# Patient Record
Sex: Male | Born: 1955 | Race: White | Hispanic: No | Marital: Married | Smoking: Former smoker
Health system: Southern US, Community
[De-identification: ages and names within clinical notes are randomized; demographics above are authoritative.]

## PROBLEM LIST (undated history)

## (undated) DIAGNOSIS — M109 Gout, unspecified: Secondary | ICD-10-CM

## (undated) DIAGNOSIS — Z8619 Personal history of other infectious and parasitic diseases: Secondary | ICD-10-CM

## (undated) DIAGNOSIS — Z86010 Personal history of colonic polyps: Secondary | ICD-10-CM

## (undated) HISTORY — DX: Personal history of other infectious and parasitic diseases: Z86.19

## (undated) HISTORY — DX: Personal history of colonic polyps: Z86.010

## (undated) HISTORY — PX: TONSILLECTOMY: SUR1361

---

## 2016-04-11 ENCOUNTER — Emergency Department (INDEPENDENT_AMBULATORY_CARE_PROVIDER_SITE_OTHER)
Admission: EM | Admit: 2016-04-11 | Discharge: 2016-04-11 | Disposition: A | Discharge status: Home / Self Care | Payer: 59 | Source: Home / Self Care | Attending: Family Medicine | Admitting: Family Medicine

## 2016-04-11 ENCOUNTER — Encounter: Payer: Self-pay | Admitting: *Deleted

## 2016-04-11 DIAGNOSIS — Z8679 Personal history of other diseases of the circulatory system: Secondary | ICD-10-CM | POA: Diagnosis not present

## 2016-04-11 DIAGNOSIS — M10072 Idiopathic gout, left ankle and foot: Secondary | ICD-10-CM

## 2016-04-11 HISTORY — DX: Gout, unspecified: M10.9

## 2016-04-11 MED ORDER — INDOMETHACIN 50 MG PO CAPS: 50.0000 mg | ORAL_CAPSULE | Freq: Two times a day (BID) | ORAL | Status: DC

## 2016-04-11 MED ORDER — PREDNISONE 20 MG PO TABS: ORAL_TABLET | ORAL | Status: DC

## 2016-04-11 NOTE — ED Provider Notes (Signed)
CSN: [id number]     Arrival date & time 04/11/16  1254 History   None    Chief Complaint  Patient presents with  . Foot Pain   (Consider location/radiation/quality/duration/timing/severity/associated sxs/prior Treatment) HPI  Randy Henderson is a 60 y.o. male presenting to UC with c/o gradually worsening Left great toe pain, associated mild redness and swelling for 3 days. Hx of gout. Pain feels similar to prior gout flares. Pain is aching and sore, worse with palpation. Pain is 2/10 at this time but he knows it can get a lot worse if he delays treatment. Has had prednisone in the past and does well. Denies being on colchicine or indomethacin in the past for flares. reports only 1 flare per year. Denies injury to his toe or foot. Denies fever, n/v/d.  BP elevated in triage: 169/93. Hx of HTN and notes he was on a "very low dose" BP medication that "didn't seem to be doing anything" so he took himself off the medication and attempted to watch his diet. He notes he checks his BP at home and it is normally good. That was about 1 year ago. Denies headache, dizziness, chest pain or SOB.    Past Medical History  Diagnosis Date  . Gout    Past Surgical History  Procedure Laterality Date  . Tonsillectomy     Family History  Problem Relation Age of Onset  . Heart failure Mother   . Stroke Father    Social History  Substance Use Topics  . Smoking status: Former Smoker -- 0.00 packs/day  . Smokeless tobacco: None  . Alcohol Use: Yes     Comment: 7 q wk    Review of Systems  Constitutional: Negative for fever and chills.  Cardiovascular: Negative for chest pain, palpitations and leg swelling.  Musculoskeletal: Positive for myalgias, joint swelling and arthralgias.       Left great toe  Skin: Positive for color change. Negative for rash and wound.  Neurological: Negative for dizziness, light-headedness and headaches.    Allergies  Review of patient's allergies indicates no known  allergies.  Home Medications   Prior to Admission medications   Medication Sig Start Date End Date Taking? Authorizing Provider  indomethacin (INDOCIN) 50 MG capsule Take 1 capsule (50 mg total) by mouth 2 (two) times daily with a meal. 04/11/16   Noland Fordyce, PA-C  predniSONE (DELTASONE) 20 MG tablet 3 tabs po day one, then 2 po daily x 4 days 04/11/16   Noland Fordyce, PA-C   Meds Ordered and Administered this Visit  Medications - No data to display  BP 151/94 mmHg  Pulse 92  Temp(Src) 98.3 F (36.8 C) (Oral)  Resp 16  Ht 5\' 8"  (1.727 m)  Wt 195 lb (88.451 kg)  BMI 29.66 kg/m2  SpO2 96% No data found.   Physical Exam  Constitutional: He is oriented to person, place, and time. He appears well-developed and well-nourished.  HENT:  Head: Normocephalic and atraumatic.  Eyes: EOM are normal.  Neck: Normal range of motion.  Cardiovascular: Normal rate.   Left great toe: cap refill < 3 seconds  Pulmonary/Chest: Effort normal.  Musculoskeletal: Normal range of motion. He exhibits edema and tenderness.  Left great toe: mild edema, tender to touch. Full ROM. No crepitus.  Neurological: He is alert and oriented to person, place, and time.  Skin: Skin is warm and dry. There is erythema.  Left great toe: skin in tact, mild erythema. No ecchymosis.  Psychiatric: He  has a normal mood and affect. His behavior is normal.  Nursing note and vitals reviewed.   ED Course  Procedures (including critical care time)  Labs Review Labs Reviewed - No data to display  Imaging Review No results found.     MDM   1. Acute idiopathic gout of left foot   2. History of hypertension    Pt c/o Left great toe pain and swelling. Symptoms c/w prior gout flare. Exam c/w gout. No evidence of underlying infection.  BP elevated in triage. Hx of HTN, not currently on BP medication. Pt asymptomatic.  Recheck of BP: 151/94. Encouraged to monitor BP at home.   Rx: prednisone and indomethacin  (advised pt may take OTC Naproxen if he decides he does not want the indomethacin). Encouraged establishing care with a PCP as pt recently moved from [place]. Patient verbalized understanding and agreement with treatment plan.    Noland Fordyce, PA-C 04/11/16 1331

## 2016-04-11 NOTE — Discharge Instructions (Signed)
You have been prescribed 5 days of prednisone, an oral steroid to help with inflammation and itching.  You may start this medication tomorrow with breakfast as it can make it difficult to sleep if taken at night.  You are also being prescribed indomethacin, an antiinflammatory (NSAID) most commonly used for the treatment of pain and inflammation associated with a gout attack.  If you do not want to take indomethacin, you may take over the counter Naproxen (Aleve) 500mg  twice daily for pain and swelling.   Gout Gout is an inflammatory arthritis caused by a buildup of uric acid crystals in the joints. Uric acid is a chemical that is normally present in the blood. When the level of uric acid in the blood is too high it can form crystals that deposit in your joints and tissues. This causes joint redness, soreness, and swelling (inflammation). Repeat attacks are common. Over time, uric acid crystals can form into masses (tophi) near a joint, destroying bone and causing disfigurement. Gout is treatable and often preventable. CAUSES  The disease begins with elevated levels of uric acid in the blood. Uric acid is produced by your body when it breaks down a naturally found substance called purines. Certain foods you eat, such as meats and fish, contain high amounts of purines. Causes of an elevated uric acid level include:  Being passed down from parent to child (heredity).  Diseases that cause increased uric acid production (such as obesity, psoriasis, and certain cancers).  Excessive alcohol use.  Diet, especially diets rich in meat and seafood.  Medicines, including certain cancer-fighting medicines (chemotherapy), water pills (diuretics), and aspirin.  Chronic kidney disease. The kidneys are no longer able to remove uric acid well.  Problems with metabolism. Conditions strongly associated with gout include:  Obesity.  High blood pressure.  High cholesterol.  Diabetes. Not everyone with  elevated uric acid levels gets gout. It is not understood why some people get gout and others do not. Surgery, joint injury, and eating too much of certain foods are some of the factors that can lead to gout attacks. SYMPTOMS   An attack of gout comes on quickly. It causes intense pain with redness, swelling, and warmth in a joint.  Fever can occur.  Often, only one joint is involved. Certain joints are more commonly involved:  Base of the big toe.  Knee.  Ankle.  Wrist.  Finger. Without treatment, an attack usually goes away in a few days to weeks. Between attacks, you usually will not have symptoms, which is different from many other forms of arthritis. DIAGNOSIS  Your caregiver will suspect gout based on your symptoms and exam. In some cases, tests may be recommended. The tests may include:  Blood tests.  Urine tests.  X-rays.  Joint fluid exam. This exam requires a needle to remove fluid from the joint (arthrocentesis). Using a microscope, gout is confirmed when uric acid crystals are seen in the joint fluid. TREATMENT  There are two phases to gout treatment: treating the sudden onset (acute) attack and preventing attacks (prophylaxis).  Treatment of an Acute Attack.  Medicines are used. These include anti-inflammatory medicines or steroid medicines.  An injection of steroid medicine into the affected joint is sometimes necessary.  The painful joint is rested. Movement can worsen the arthritis.  You may use warm or cold treatments on painful joints, depending which works best for you.  Treatment to Prevent Attacks.  If you suffer from frequent gout attacks, your caregiver may advise  preventive medicine. These medicines are started after the acute attack subsides. These medicines either help your kidneys eliminate uric acid from your body or decrease your uric acid production. You may need to stay on these medicines for a very long time.  The early phase of treatment  with preventive medicine can be associated with an increase in acute gout attacks. For this reason, during the first few months of treatment, your caregiver may also advise you to take medicines usually used for acute gout treatment. Be sure you understand your caregiver's directions. Your caregiver may make several adjustments to your medicine dose before these medicines are effective.  Discuss dietary treatment with your caregiver or dietitian. Alcohol and drinks high in sugar and fructose and foods such as meat, poultry, and seafood can increase uric acid levels. Your caregiver or dietitian can advise you on drinks and foods that should be limited. HOME CARE INSTRUCTIONS   Do not take aspirin to relieve pain. This raises uric acid levels.  Only take over-the-counter or prescription medicines for pain, discomfort, or fever as directed by your caregiver.  Rest the joint as much as possible. When in bed, keep sheets and blankets off painful areas.  Keep the affected joint raised (elevated).  Apply warm or cold treatments to painful joints. Use of warm or cold treatments depends on which works best for you.  Use crutches if the painful joint is in your leg.  Drink enough fluids to keep your urine clear or pale yellow. This helps your body get rid of uric acid. Limit alcohol, sugary drinks, and fructose drinks.  Follow your dietary instructions. Pay careful attention to the amount of protein you eat. Your daily diet should emphasize fruits, vegetables, whole grains, and fat-free or low-fat milk products. Discuss the use of coffee, vitamin C, and cherries with your caregiver or dietitian. These may be helpful in lowering uric acid levels.  Maintain a healthy body weight. SEEK MEDICAL CARE IF:   You develop diarrhea, vomiting, or any side effects from medicines.  You do not feel better in 24 hours, or you are getting worse. SEEK IMMEDIATE MEDICAL CARE IF:   Your joint becomes suddenly more  tender, and you have chills or a fever. MAKE SURE YOU:   Understand these instructions.  Will watch your condition.  Will get help right away if you are not doing well or get worse.   This information is not intended to replace advice given to you by your health care provider. Make sure you discuss any questions you have with your health care provider.   Document Released: 10/14/2000 Document Revised: 11/07/2014 Document Reviewed: 05/30/2012 Elsevier Interactive Patient Education 2016 [place] are compounds that affect the level of uric acid in your body. A low-purine diet is a diet that is low in purines. Eating a low-purine diet can prevent the level of uric acid in your body from getting too high and causing gout or kidney stones or both. WHAT DO I NEED TO KNOW ABOUT THIS DIET?  Choose low-purine foods. Examples of low-purine foods are listed in the next section.  Drink plenty of fluids, especially water. Fluids can help remove uric acid from your body. Try to drink 8-16 cups (1.9-3.8 L) a day.  Limit foods high in fat, especially saturated fat, as fat makes it harder for the body to get rid of uric acid. Foods high in saturated fat include pizza, cheese, ice cream, whole milk, fried foods, and  gravies. Choose foods that are lower in fat and lean sources of protein. Use olive oil when cooking as it contains healthy fats that are not high in saturated fat.  Limit alcohol. Alcohol interferes with the elimination of uric acid from your body. If you are having a gout attack, avoid all alcohol.  Keep in mind that different people's bodies react differently to different foods. You will probably learn over time which foods do or do not affect you. If you discover that a food tends to cause your gout to flare up, avoid eating that food. You can more freely enjoy foods that do not cause problems. If you have any questions about a food item, talk to your dietitian or  health care provider. WHICH FOODS ARE LOW, MODERATE, AND HIGH IN PURINES? The following is a list of foods that are low, moderate, and high in purines. You can eat any amount of the foods that are low in purines. You may be able to have small amounts of foods that are moderate in purines. Ask your health care provider how much of a food moderate in purines you can have. Avoid foods high in purines. Grains  Foods low in purines: Enriched white bread, pasta, rice, cake, cornbread, popcorn.  Foods moderate in purines: Whole-grain breads and cereals, wheat germ, bran, oatmeal. Uncooked oatmeal. Dry wheat bran or wheat germ.  Foods high in purines: Pancakes, [place] toast, biscuits, muffins. Vegetables  Foods low in purines: All vegetables, except those that are moderate in purines.  Foods moderate in purines: Asparagus, cauliflower, spinach, mushrooms, green peas. Fruits  All fruits are low in purines. Meats and other Protein Foods  Foods low in purines: Eggs, nuts, peanut butter.  Foods moderate in purines: 80-90% lean beef, lamb, veal, pork, poultry, fish, eggs, peanut butter, nuts. Crab, lobster, oysters, and shrimp. Cooked dried beans, peas, and lentils.  Foods high in purines: Anchovies, sardines, herring, mussels, tuna, codfish, scallops, trout, and haddock. [place] Salines. Organ meats (such as liver or kidney). Tripe. Game meat. Goose. Sweetbreads. Dairy  All dairy foods are low in purines. Low-fat and fat-free dairy products are best because they are low in saturated fat. Beverages  Drinks low in purines: Water, carbonated beverages, tea, coffee, cocoa.  Drinks moderate in purines: Soft drinks and other drinks sweetened with high-fructose corn syrup. Juices. To find whether a food or drink is sweetened with high-fructose corn syrup, look at the ingredients list.  Drinks high in purines: Alcoholic beverages (such as beer). Condiments  Foods low in purines: Salt, herbs, olives, pickles,  relishes, vinegar.  Foods moderate in purines: Butter, margarine, oils, mayonnaise. Fats and Oils  Foods low in purines: All types, except gravies and sauces made with meat.  Foods high in purines: Gravies and sauces made with meat. Other Foods  Foods low in purines: Sugars, sweets, gelatin. Cake. Soups made without meat.  Foods moderate in purines: Meat-based or fish-based soups, broths, or bouillons. Foods and drinks sweetened with high-fructose corn syrup.  Foods high in purines: High-fat desserts (such as ice cream, cookies, cakes, pies, doughnuts, and chocolate). Contact your dietitian for more information on foods that are not listed here.   This information is not intended to replace advice given to you by your health care provider. Make sure you discuss any questions you have with your health care provider.   Document Released: 02/11/2011 Document Revised: 10/22/2013 Document Reviewed: 09/23/2013 Elsevier Interactive Patient Education [employer].

## 2016-04-11 NOTE — ED Notes (Signed)
Pt c/o LT great toe and foot swelling x 3 days. Reports hx of gout. Denies injury.

## 2017-08-28 ENCOUNTER — Encounter: Payer: Self-pay | Admitting: Emergency Medicine

## 2017-08-28 ENCOUNTER — Emergency Department (INDEPENDENT_AMBULATORY_CARE_PROVIDER_SITE_OTHER)
Admission: EM | Admit: 2017-08-28 | Discharge: 2017-08-28 | Disposition: A | Discharge status: Home / Self Care | Payer: 59 | Source: Home / Self Care | Attending: Family Medicine | Admitting: Family Medicine

## 2017-08-28 DIAGNOSIS — M10072 Idiopathic gout, left ankle and foot: Secondary | ICD-10-CM

## 2017-08-28 MED ORDER — PREDNISONE 20 MG PO TABS: ORAL_TABLET | ORAL | 0 refills | Status: DC

## 2017-08-28 MED ORDER — INDOMETHACIN 50 MG PO CAPS: 50.0000 mg | ORAL_CAPSULE | Freq: Two times a day (BID) | ORAL | 1 refills | Status: DC

## 2017-08-28 NOTE — ED Provider Notes (Signed)
[name]    CSN: [id number] Arrival date & time: 08/28/17  1448     History   Chief Complaint Chief Complaint  Patient presents with  . Foot Pain    HPI Randy Henderson is a 61 y.o. male.   HPI  Randy Henderson is a 61 y.o. male presenting to UC with c/o sudden onset Left foot pain in his 2nd toe that started last night. Pain is burning and sore. Pain similar to prior gout flares. Last flare was about 1 year ago. He does well on indomethacin and prednisone.  Denies injury to his foot or toes.    Past Medical History:  Diagnosis Date  . Gout     There are no active problems to display for this patient.   Past Surgical History:  Procedure Laterality Date  . TONSILLECTOMY         Home Medications    Prior to Admission medications   Medication Sig Start Date End Date Taking? Authorizing Provider  indomethacin (INDOCIN) 50 MG capsule Take 1 capsule (50 mg total) by mouth 2 (two) times daily with a meal. 08/28/17   Gerarda Fraction, Bronwen Betters, PA-C  predniSONE (DELTASONE) 20 MG tablet 3 tabs po day one, then 2 po daily x 4 days 08/28/17   Noe Gens, PA-C    Family History Family History  Problem Relation Age of Onset  . Heart failure Mother   . Stroke Father     Social History Social History  Substance Use Topics  . Smoking status: Former Smoker    Packs/day: 0.00  . Smokeless tobacco: Never Used  . Alcohol use Yes     Comment: 7 q wk     Allergies   Patient has no known allergies.   Review of Systems Review of Systems  Constitutional: Negative for chills and fever.  Musculoskeletal: Positive for arthralgias. Negative for joint swelling and myalgias.  Skin: Negative for color change, rash and wound.  Neurological: Negative for weakness and numbness.     Physical Exam Triage Vital Signs ED Triage Vitals  Enc Vitals Group     BP 08/28/17 1515 (!) 149/90     Pulse Rate 08/28/17 1515 70     Resp --      Temp 08/28/17 1515 97.8 F (36.6  C)     Temp Source 08/28/17 1515 Oral     SpO2 08/28/17 1515 96 %     Weight 08/28/17 1516 200 lb (90.7 kg)     Height --      Head Circumference --      Peak Flow --      Pain Score 08/28/17 1516 3     Pain Loc --      Pain Edu? --      Excl. in [place]? --    No data found.   Updated Vital Signs BP (!) 149/90 (BP Location: Right Arm)   Pulse 70   Temp 97.8 F (36.6 C) (Oral)   Wt 200 lb (90.7 kg)   SpO2 96%   BMI 30.41 kg/m      Physical Exam  Constitutional: He is oriented to person, place, and time. He appears well-developed and well-nourished. No distress.  HENT:  Head: Normocephalic and atraumatic.  Eyes: EOM are normal.  Neck: Normal range of motion.  Cardiovascular: Normal rate.   Pulmonary/Chest: Effort normal.  Musculoskeletal: Normal range of motion. He exhibits tenderness. He exhibits no edema.  Left foot: second toe- tender. No  obvious edema. Full ROM all toes. No tenderness to great toe.   Neurological: He is alert and oriented to person, place, and time.  Skin: Skin is warm and dry. Capillary refill takes less than 2 seconds. He is not diaphoretic. No erythema.  Left foot including 2nd toe: skin in tact. No ecchymosis or erythema.   Psychiatric: He has a normal mood and affect. His behavior is normal.  Nursing note and vitals reviewed.    UC Treatments / Results  Labs (all labs ordered are listed, but only abnormal results are displayed) Labs Reviewed - No data to display  EKG  EKG Interpretation None       Radiology No results found.  Procedures Procedures (including critical care time)  Medications Ordered in UC Medications - No data to display   Initial Impression / Assessment and Plan / UC Course  I have reviewed the triage vital signs and the nursing notes.  Pertinent labs & imaging results that were available during my care of the patient were reviewed by me and considered in my medical decision making (see chart for details).       Hx and exam c/w gout Will prescribe indomethacin and prednisone as pt has done well with this combination in the past.   Encouraged him to monitor his BP F/u with PCP for ongoing healthcare needs including recurrent gout and BP.  Final Clinical Impressions(s) / UC Diagnoses   Final diagnoses:  Acute idiopathic gout involving toe of left foot    New Prescriptions Discharge Medication List as of 08/28/2017  3:31 PM       Controlled Substance Prescriptions [place] Controlled Substance Registry consulted? Not Applicable   [name] 08/28/17 1627

## 2017-08-28 NOTE — Discharge Instructions (Signed)
°  Indomethacin is an antiinflammatory medication, it is recommended that you do not take other NSAIDs such as ibuprofen, naproxen or aspirin.  This can lead to stomach upset or even stomach ulcers.

## 2017-08-28 NOTE — ED Triage Notes (Signed)
Pt c/o left foot pain that started suddenly last night. Pain is on bottom of foot. States hx of gout and sxs feel like that.

## 2018-01-29 ENCOUNTER — Other Ambulatory Visit: Payer: Self-pay

## 2018-01-29 ENCOUNTER — Emergency Department (INDEPENDENT_AMBULATORY_CARE_PROVIDER_SITE_OTHER)
Admission: EM | Admit: 2018-01-29 | Discharge: 2018-01-29 | Disposition: A | Discharge status: Home / Self Care | Payer: 59 | Source: Home / Self Care

## 2018-01-29 ENCOUNTER — Encounter: Payer: Self-pay | Admitting: *Deleted

## 2018-01-29 DIAGNOSIS — M109 Gout, unspecified: Secondary | ICD-10-CM

## 2018-01-29 DIAGNOSIS — M79672 Pain in left foot: Secondary | ICD-10-CM

## 2018-01-29 MED ORDER — PREDNISONE 10 MG PO TABS: ORAL_TABLET | ORAL | 0 refills | Status: DC

## 2018-01-29 MED ORDER — INDOMETHACIN 25 MG PO CAPS: 25.0000 mg | ORAL_CAPSULE | Freq: Three times a day (TID) | ORAL | 0 refills | Status: DC | PRN

## 2018-01-29 NOTE — ED Triage Notes (Signed)
Pt c/o LT foot pain x 2 days. He reports a hx of gout. He has taken Prednisone in the past with relief.

## 2018-01-29 NOTE — ED Provider Notes (Signed)
[name]    CSN: [id number] Arrival date & time: 01/29/18  1010     History   Chief Complaint Chief Complaint  Patient presents with  . Foot Pain    HPI Randy Henderson is a 62 y.o. male.   HPI Patient is having pain in his left ankle.  This is much like the pain he has had with gout in the past.  No injury.  No known food trigger.  He is been having flareups a little more often of late.  He is reluctant to think about any long-term medication treatment.  Prednisone always calmed right down. Past Medical History:  Diagnosis Date  . Gout     There are no active problems to display for this patient.   Past Surgical History:  Procedure Laterality Date  . TONSILLECTOMY         Home Medications    Prior to Admission medications   Not on File    Family History Family History  Problem Relation Age of Onset  . Heart failure Mother   . Stroke Father     Social History Social History   Tobacco Use  . Smoking status: Former Smoker    Packs/day: 0.00  . Smokeless tobacco: Never Used  Substance Use Topics  . Alcohol use: Yes    Comment: 7 q wk  . Drug use: No     Allergies   Patient has no known allergies.   Review of Systems Review of Systems No other major complaints. Physical Exam Triage Vital Signs ED Triage Vitals  Enc Vitals Group     BP 01/29/18 1106 (!) 160/99     Pulse Rate 01/29/18 1106 60     Resp 01/29/18 1106 18     Temp 01/29/18 1106 98.2 F (36.8 C)     Temp Source 01/29/18 1106 Oral     SpO2 01/29/18 1106 97 %     Weight 01/29/18 1107 203 lb (92.1 kg)     Height 01/29/18 1107 5\' 8"  (1.727 m)     Head Circumference --      Peak Flow --      Pain Score 01/29/18 1107 2     Pain Loc --      Pain Edu? --      Excl. in [place]? --    No data found.  Updated Vital Signs BP (!) 160/99 (BP Location: Right Arm)   Pulse 60   Temp 98.2 F (36.8 C) (Oral)   Resp 18   Ht 5\' 8"  (1.727 m)   Wt 203 lb (92.1 kg)   SpO2 97%    BMI 30.87 kg/m   Visual Acuity Right Eye Distance:   Left Eye Distance:   Bilateral Distance:    Right Eye Near:   Left Eye Near:    Bilateral Near:     Physical Exam  Mild erythema of left great toe but it is not painful.  Left ankle laterally just anterior to the lateral malleolus seems to be the area of maximum tenderness and pain. UC Treatments / Results  Labs (all labs ordered are listed, but only abnormal results are displayed) Labs Reviewed - No data to display  EKG None Radiology No results found.  Procedures Procedures (including critical care time)  Medications Ordered in UC Medications - No data to display   Initial Impression / Assessment and Plan / UC Course  I have reviewed the triage vital signs and the nursing notes.  Pertinent labs & imaging results that were available during my care of the patient were reviewed by me and considered in my medical decision making (see chart for details).     Acute gouty arthritis.  With more frequency of attacks I have recommended he consider being on allopurinol if the level is significantly elevated.  We do not have a baseline, but will check some blood work while he is here today.  We will let him know the results of that.  Final Clinical Impressions(s) / UC Diagnoses   Final diagnoses:  None    ED Discharge Orders    None     Drink plenty of fluids to stay well-hydrated.  Review online the listing of foods known to trigger gout.  Take prednisone 3 daily for 2 days, then 2 daily for 2 days, then 1 daily for 2 days  Take indomethacin 25 mg 1 3 times daily as needed for painful inflammation  I am checking your uric acid level and liver and kidney function tests.  It is important to know what kind of a baseline level you run.  If the uric acid is too high, it is worth seriously considering going on long-term prophylactic medications such as allopurinol.  Return as needed  Controlled Substance  Prescriptions [place] Controlled Substance Registry consulted? No   Posey Boyer, MD 01/29/18 1212

## 2018-01-29 NOTE — Discharge Instructions (Addendum)
Drink plenty of fluids to stay well-hydrated.  Review online the listing of foods known to trigger gout.  Take prednisone 3 daily for 2 days, then 2 daily for 2 days, then 1 daily for 2 days  Take indomethacin 25 mg 1 3 times daily as needed for painful inflammation  I am checking your uric acid level and liver and kidney function tests.  It is important to know what kind of a baseline level you run.  If the uric acid is too high, it is worth seriously considering going on long-term prophylactic medications such as allopurinol.  Return as needed

## 2018-01-30 ENCOUNTER — Telehealth: Payer: Self-pay

## 2018-01-30 LAB — COMPLETE METABOLIC PANEL WITH GFR
AG RATIO: 1.5 (calc) (ref 1.0–2.5)
ALT: 24 U/L (ref 9–46)
AST: 20 U/L (ref 10–35)
Albumin: 4.1 g/dL (ref 3.6–5.1)
Alkaline phosphatase (APISO): 68 U/L (ref 40–115)
BILIRUBIN TOTAL: 0.4 mg/dL (ref 0.2–1.2)
BUN: 17 mg/dL (ref 7–25)
CALCIUM: 9.3 mg/dL (ref 8.6–10.3)
CO2: 28 mmol/L (ref 20–32)
Chloride: 105 mmol/L (ref 98–110)
Creat: 0.97 mg/dL (ref 0.70–1.25)
GFR, EST AFRICAN AMERICAN: 97 mL/min/{1.73_m2} (ref >=60)
GFR, EST NON AFRICAN AMERICAN: 83 mL/min/{1.73_m2} (ref >=60)
GLUCOSE: 107 mg/dL — AB (ref 65–99)
Globulin: 2.7 g/dL (calc) (ref 1.9–3.7)
Potassium: 4.8 mmol/L (ref 3.5–5.3)
SODIUM: 139 mmol/L (ref 135–146)
Total Protein: 6.8 g/dL (ref 6.1–8.1)

## 2018-01-30 LAB — URIC ACID: URIC ACID, SERUM: 6.2 mg/dL (ref 4.0–8.0)

## 2018-01-30 NOTE — Telephone Encounter (Signed)
Left message for patient with lab results and follow up that Dr. Linna Darner would like him to have a uric acid level drawn at some point when he is not having a gout attack to see what his "normal" is.  Left call back information any questions

## 2018-08-16 ENCOUNTER — Other Ambulatory Visit: Payer: Self-pay

## 2018-08-16 ENCOUNTER — Ambulatory Visit (INDEPENDENT_AMBULATORY_CARE_PROVIDER_SITE_OTHER): Payer: 59

## 2018-08-16 ENCOUNTER — Ambulatory Visit (INDEPENDENT_AMBULATORY_CARE_PROVIDER_SITE_OTHER): Payer: 59 | Admitting: Family Medicine

## 2018-08-16 ENCOUNTER — Encounter: Payer: Self-pay | Admitting: Family Medicine

## 2018-08-16 VITALS — BP 121/80 | HR 81 | Temp 98.1°F | Resp 16 | Ht 68.0 in | Wt 203.1 lb

## 2018-08-16 DIAGNOSIS — Z1283 Encounter for screening for malignant neoplasm of skin: Secondary | ICD-10-CM

## 2018-08-16 DIAGNOSIS — M25521 Pain in right elbow: Secondary | ICD-10-CM | POA: Diagnosis not present

## 2018-08-16 DIAGNOSIS — Z23 Encounter for immunization: Secondary | ICD-10-CM

## 2018-08-16 DIAGNOSIS — Z1211 Encounter for screening for malignant neoplasm of colon: Secondary | ICD-10-CM | POA: Diagnosis not present

## 2018-08-16 DIAGNOSIS — E66811 Obesity, class 1: Secondary | ICD-10-CM | POA: Insufficient documentation

## 2018-08-16 DIAGNOSIS — M545 Low back pain, unspecified: Secondary | ICD-10-CM

## 2018-08-16 DIAGNOSIS — E669 Obesity, unspecified: Secondary | ICD-10-CM

## 2018-08-16 DIAGNOSIS — S3992XA Unspecified injury of lower back, initial encounter: Secondary | ICD-10-CM | POA: Diagnosis not present

## 2018-08-16 MED ORDER — MELOXICAM 15 MG PO TABS: 15.0000 mg | ORAL_TABLET | Freq: Every day | ORAL | 0 refills | Status: DC

## 2018-08-16 NOTE — Patient Instructions (Addendum)
Schedule your complete physical in 6 months GO to our [place] office ([place]) for your Annabelle Harman notify you of your lab results and make any changes if needed START the Meloxicam once daily- take w/ food- for pain/inflammation ICE the elbow HEAT the back We'll call you with your derm and GI appts Call with any questions or concerns- particularly if pain is not improving Welcome!  We're glad to have you!!

## 2018-08-16 NOTE — Assessment & Plan Note (Signed)
New to provider, ongoing for pt.  Localized to lumbar spine.  Questionable decreased disc height.  Will get xrays to assess.  Start scheduled NSAIDs.  If no improvement, will need referral.  Pt expressed understanding and is in agreement w/ plan.

## 2018-08-16 NOTE — Assessment & Plan Note (Signed)
New.  Pt's frustrated that his exercise is limited by back pain.  Is swimming for exercise.  Check labs to risk stratify.  Will follow.

## 2018-08-16 NOTE — Progress Notes (Signed)
   Subjective:    Patient ID: Randy Henderson, male    DOB: 08-12-1956, 62 y.o.   MRN: [id number]  HPI New to establish.  No recent PCP.  Tennis Elbow- R elbow 'swelled up like a baseball in March'.  Since then has noticed decreased strength by 'maybe 50%'.  R hand dominant.  No known injury.  Denies TTP.  Tends to have pain w/ repetitive motion.  Back pain- 10-12 yrs ago lifted something improperly and was in bed for 2 weeks.  sxs completely resolved and then 1 yr ago again lifted improperly.  Has had pain since.  Pain is localized to lower back.  Pain is midline and will radiate to R side.  Pain will radiate into buttock and thigh.  No numbness/tingling associated.  Does not take OTC meds.  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  Obesity- BMI 30.9.  + family hx CAD.  Exercise has been limited due to back pain but was previously very active.  Will swim.    Skin check- pt would like derm referral  Health maintenance- has never had colonoscopy, due for Tdap.    Review of Systems For ROS see HPI     Objective:   Physical Exam  Constitutional: He is oriented to person, place, and time. He appears well-developed and well-nourished. No distress.  obese  HENT:  Head: Normocephalic and atraumatic.  Eyes: Pupils are equal, round, and reactive to light. Conjunctivae and EOM are normal.  Neck: Normal range of motion. Neck supple. No thyromegaly present.  Cardiovascular: Normal rate, regular rhythm, normal heart sounds and intact distal pulses.  No murmur heard. Pulmonary/Chest: Effort normal and breath sounds normal. No respiratory distress.  Abdominal: Soft. Bowel sounds are normal. He exhibits no distension.  Musculoskeletal: He exhibits tenderness (TTP over lumbar spine w/ questionable reduced disc space). He exhibits no edema.  TTP over medial and lateral epicondyle  Lymphadenopathy:    He has no cervical adenopathy.  Neurological: He is alert and oriented to person, place,  and time. No cranial nerve deficit.  (-) SLR bilaterally  Skin: Skin is warm and dry.  Psychiatric: He has a normal mood and affect. His behavior is normal.  Vitals reviewed.         Assessment & Plan:

## 2018-08-16 NOTE — Assessment & Plan Note (Signed)
New.  Pt w/ both medial and lateral epicondylitis.  Start scheduled NSAIDs.  Ice.  If no improvement will need either immobilization or steroid injection.  Pt expressed understanding and is in agreement w/ plan.

## 2018-08-17 LAB — CBC WITH DIFFERENTIAL/PLATELET
BASOS PCT: 0.9 % (ref 0.0–3.0)
Basophils Absolute: 0.1 10*3/uL (ref 0.0–0.1)
EOS ABS: 0.1 10*3/uL (ref 0.0–0.7)
Eosinophils Relative: 1.5 % (ref 0.0–5.0)
HCT: 44.2 % (ref 39.0–52.0)
Hemoglobin: 15.1 g/dL (ref 13.0–17.0)
LYMPHS ABS: 2.4 10*3/uL (ref 0.7–4.0)
Lymphocytes Relative: 35.7 % (ref 12.0–46.0)
MCHC: 34.2 g/dL (ref 30.0–36.0)
MCV: 91.3 fl (ref 78.0–100.0)
MONO ABS: 0.7 10*3/uL (ref 0.1–1.0)
Monocytes Relative: 10.3 % (ref 3.0–12.0)
NEUTROS ABS: 3.5 10*3/uL (ref 1.4–7.7)
NEUTROS PCT: 51.6 % (ref 43.0–77.0)
PLATELETS: 207 10*3/uL (ref 150.0–400.0)
RBC: 4.84 Mil/uL (ref 4.22–5.81)
RDW: 12.9 % (ref 11.5–15.5)
WBC: 6.8 10*3/uL (ref 4.0–10.5)

## 2018-08-17 LAB — LIPID PANEL
CHOLESTEROL: 169 mg/dL (ref 0–200)
HDL: 40.6 mg/dL (ref >39.00)
NONHDL: 127.91
TRIGLYCERIDES: 294 mg/dL — AB (ref 0.0–149.0)
Total CHOL/HDL Ratio: 4
VLDL: 58.8 mg/dL — ABNORMAL HIGH (ref 0.0–40.0)

## 2018-08-17 LAB — BASIC METABOLIC PANEL
BUN: 24 mg/dL — ABNORMAL HIGH (ref 6–23)
CHLORIDE: 100 meq/L (ref 96–112)
CO2: 31 meq/L (ref 19–32)
CREATININE: 1.16 mg/dL (ref 0.40–1.50)
Calcium: 9.9 mg/dL (ref 8.4–10.5)
GFR: 67.48 mL/min (ref >60.00)
Glucose, Bld: 102 mg/dL — ABNORMAL HIGH (ref 70–99)
Potassium: 3.8 mEq/L (ref 3.5–5.1)
Sodium: 139 mEq/L (ref 135–145)

## 2018-08-17 LAB — HEPATIC FUNCTION PANEL
ALT: 16 U/L (ref 0–53)
AST: 16 U/L (ref 0–37)
Albumin: 4.7 g/dL (ref 3.5–5.2)
Alkaline Phosphatase: 65 U/L (ref 39–117)
BILIRUBIN DIRECT: 0.1 mg/dL (ref 0.0–0.3)
TOTAL PROTEIN: 7.8 g/dL (ref 6.0–8.3)
Total Bilirubin: 0.7 mg/dL (ref 0.2–1.2)

## 2018-08-17 LAB — TSH: TSH: 1.37 u[IU]/mL (ref 0.35–4.50)

## 2018-08-17 LAB — LDL CHOLESTEROL, DIRECT: LDL DIRECT: 87 mg/dL

## 2018-08-22 ENCOUNTER — Encounter: Payer: Self-pay | Admitting: Internal Medicine

## 2018-09-08 ENCOUNTER — Other Ambulatory Visit: Payer: Self-pay | Admitting: Family Medicine

## 2018-09-24 ENCOUNTER — Encounter: Payer: Self-pay | Admitting: Internal Medicine

## 2018-09-24 ENCOUNTER — Ambulatory Visit (AMBULATORY_SURGERY_CENTER): Payer: Self-pay | Admitting: *Deleted

## 2018-09-24 VITALS — Ht 68.0 in | Wt 203.0 lb

## 2018-09-24 DIAGNOSIS — Z1211 Encounter for screening for malignant neoplasm of colon: Secondary | ICD-10-CM

## 2018-09-24 NOTE — Progress Notes (Signed)
No egg or soy allergy known to patient  No issues with past sedation with any surgeries  or procedures, no past intubation  No diet pills per patient No home 02 use per patient  No blood thinners per patient  Pt denies issues with constipation  No A fib or A flutter  EMMI video sent to pt's e mail -- pt declined   

## 2018-10-08 ENCOUNTER — Ambulatory Visit (AMBULATORY_SURGERY_CENTER): Payer: 59 | Admitting: Internal Medicine

## 2018-10-08 ENCOUNTER — Encounter: Payer: Self-pay | Admitting: Internal Medicine

## 2018-10-08 VITALS — BP 129/82 | HR 78 | Temp 98.0°F | Resp 18 | Ht 68.0 in | Wt 203.0 lb

## 2018-10-08 DIAGNOSIS — D125 Benign neoplasm of sigmoid colon: Secondary | ICD-10-CM | POA: Diagnosis not present

## 2018-10-08 DIAGNOSIS — D124 Benign neoplasm of descending colon: Secondary | ICD-10-CM

## 2018-10-08 DIAGNOSIS — Z1211 Encounter for screening for malignant neoplasm of colon: Secondary | ICD-10-CM

## 2018-10-08 HISTORY — PX: COLONOSCOPY W/ POLYPECTOMY: SHX1380

## 2018-10-08 MED ORDER — SODIUM CHLORIDE 0.9 % IV SOLN: 500.0000 mL | Freq: Once | INTRAVENOUS | Status: DC

## 2018-10-08 NOTE — Progress Notes (Signed)
To recovery, report to RN, VSS. 

## 2018-10-08 NOTE — Progress Notes (Signed)
Called to room to assist during endoscopic procedure.  Patient ID and intended procedure confirmed with present staff. Received instructions for my participation in the procedure from the performing physician.  

## 2018-10-08 NOTE — Patient Instructions (Addendum)
2 little polyps removed today - I will let you know pathology results and when to have another routine colonoscopy by mail and/or My Chart.  You do have diverticulosis - thickened muscle rings and pouches in the colon wall. Please read the handout about this condition.  I appreciate the opportunity to care for you. Gatha Mayer, MD, FACG   YOU HAD AN ENDOSCOPIC PROCEDURE TODAY AT [facility]:   Refer to the procedure report that was given to you for any specific questions about what was found during the examination.  If the procedure report does not answer your questions, please call your gastroenterologist to clarify.  If you requested that your care partner not be given the details of your procedure findings, then the procedure report has been included in a sealed envelope for you to review at your convenience later.  YOU SHOULD EXPECT: Some feelings of bloating in the abdomen. Passage of more gas than usual.  Walking can help get rid of the air that was put into your GI tract during the procedure and reduce the bloating. If you had a lower endoscopy (such as a colonoscopy or flexible sigmoidoscopy) you may notice spotting of blood in your stool or on the toilet paper. If you underwent a bowel prep for your procedure, you may not have a normal bowel movement for a few days.  Please Note:  You might notice some irritation and congestion in your nose or some drainage.  This is from the oxygen used during your procedure.  There is no need for concern and it should clear up in a day or so.  SYMPTOMS TO REPORT IMMEDIATELY:   Following lower endoscopy (colonoscopy or flexible sigmoidoscopy):  Excessive amounts of blood in the stool  Significant tenderness or worsening of abdominal pains  Swelling of the abdomen that is new, acute  Fever of 100F or higher    For urgent or emergent issues, a gastroenterologist can be reached at any hour by calling [phone]  [phone].   DIET:  We do recommend a small meal at first, but then you may proceed to your regular diet.  Drink plenty of fluids but you should avoid alcoholic beverages for 24 hours.  ACTIVITY:  You should plan to take it easy for the rest of today and you should NOT DRIVE or use heavy machinery until tomorrow (because of the sedation medicines used during the test).    FOLLOW UP: Our staff will call the number listed on your records the next business day following your procedure to check on you and address any questions or concerns that you may have regarding the information given to you following your procedure. If we do not reach you, we will leave a message.  However, if you are feeling well and you are not experiencing any problems, there is no need to return our call.  We will assume that you have returned to your regular daily activities without incident.  If any biopsies were taken you will be contacted by phone or by letter within the next 1-3 weeks.  Please call us at [phone] if you have not heard about the biopsies in 3 weeks.    SIGNATURES/CONFIDENTIALITY: You and/or your care partner have signed paperwork which will be entered into your electronic medical record.  These signatures attest to the fact that that the information above on your After Visit Summary has been reviewed and is understood.  Full responsibility of the confidentiality of  this discharge information lies with you and/or your care-partner.

## 2018-10-08 NOTE — Op Note (Signed)
[facility] Patient Name: Randy Henderson Procedure Date: 10/08/2018 8:27 AM MRN: [id number] Endoscopist: Gatha Mayer , MD Age: 62 Referring MD:  Date of Birth: 1956-08-11 Gender: Male Account #: [id number] Procedure:                Colonoscopy Indications:              Screening for colorectal malignant neoplasm, This                            is the patient's first colonoscopy Medicines:                Propofol per Anesthesia, Monitored Anesthesia Care Procedure:                Pre-Anesthesia Assessment:                           - Prior to the procedure, a History and Physical                            was performed, and patient medications and                            allergies were reviewed. The patient's tolerance of                            previous anesthesia was also reviewed. The risks                            and benefits of the procedure and the sedation                            options and risks were discussed with the patient.                            All questions were answered, and informed consent                            was obtained. Prior Anticoagulants: The patient has                            taken no previous anticoagulant or antiplatelet                            agents. ASA Grade Assessment: II - A patient with                            mild systemic disease. After reviewing the risks                            and benefits, the patient was deemed in                            satisfactory condition to undergo the procedure.  After obtaining informed consent, the colonoscope                            was passed under direct vision. Throughout the                            procedure, the patient's blood pressure, pulse, and                            oxygen saturations were monitored continuously. The                            Colonoscope was introduced through the anus and   advanced to the the cecum, identified by                            appendiceal orifice and ileocecal valve. The                            colonoscopy was performed without difficulty. The                            patient tolerated the procedure well. The quality                            of the bowel preparation was excellent. The                            ileocecal valve, appendiceal orifice, and rectum                            were photographed. The bowel preparation used was                            Miralax. Scope In: 8:33:36 AM Scope Out: 8:48:23 AM Scope Withdrawal Time: 0 hours 13 minutes 15 seconds  Total Procedure Duration: 0 hours 14 minutes 47 seconds  Findings:                 The perianal and digital rectal examinations were                            normal. Pertinent negatives include normal prostate                            (size, shape, and consistency).                           Two sessile polyps were found in the sigmoid colon                            and descending colon. The polyps were 2 to 6 mm in                            size. These polyps were removed  with a cold snare.                            Resection and retrieval were complete. Verification                            of patient identification for the specimen was done.                           Multiple diverticula were found in the sigmoid                            colon and descending colon.                           The exam was otherwise without abnormality on                            direct and retroflexion views. Complications:            No immediate complications. Estimated Blood Loss:     Estimated blood loss was minimal. Impression:               - Two 2 to 6 mm polyps in the sigmoid colon and in                            the descending colon, removed with a cold snare.                            Resected and retrieved.                           - Diverticulosis in the sigmoid  colon and in the                            descending colon.                           - The examination was otherwise normal on direct                            and retroflexion views. Recommendation:           - Patient has a contact number available for                            emergencies. The signs and symptoms of potential                            delayed complications were discussed with the                            patient. Return to normal activities tomorrow.                            Written discharge instructions were provided  to the                            patient.                           - Resume previous diet.                           - Continue present medications.                           - Repeat colonoscopy is recommended. The                            colonoscopy date will be determined after pathology                            results from today's exam become available for                            review. Gatha Mayer, MD 10/08/2018 8:53:43 AM This report has been signed electronically.

## 2018-10-09 ENCOUNTER — Telehealth: Payer: Self-pay

## 2018-10-09 NOTE — Telephone Encounter (Signed)
Second follow up call attempt, no answer, message left 

## 2018-10-09 NOTE — Telephone Encounter (Signed)
First post procedure follow up call, no answer 

## 2018-10-14 ENCOUNTER — Encounter: Payer: Self-pay | Admitting: Internal Medicine

## 2018-10-14 DIAGNOSIS — Z86010 Personal history of colonic polyps: Secondary | ICD-10-CM

## 2018-10-14 DIAGNOSIS — Z860101 Personal history of adenomatous and serrated colon polyps: Secondary | ICD-10-CM | POA: Insufficient documentation

## 2018-10-14 HISTORY — DX: Personal history of adenomatous and serrated colon polyps: Z86.0101

## 2018-10-14 HISTORY — DX: Personal history of colonic polyps: Z86.010

## 2018-10-14 NOTE — Progress Notes (Signed)
Adenoma and benign mucosa Recall 2026 My Chart

## 2018-11-19 DIAGNOSIS — D229 Melanocytic nevi, unspecified: Secondary | ICD-10-CM | POA: Diagnosis not present

## 2018-11-19 DIAGNOSIS — L821 Other seborrheic keratosis: Secondary | ICD-10-CM | POA: Diagnosis not present

## 2019-01-24 ENCOUNTER — Encounter: Payer: Self-pay | Admitting: *Deleted

## 2019-02-19 ENCOUNTER — Encounter: Payer: 59 | Admitting: Family Medicine

## 2019-04-08 ENCOUNTER — Telehealth: Payer: 59 | Admitting: Physician Assistant

## 2019-04-08 ENCOUNTER — Encounter: Payer: Self-pay | Admitting: Physician Assistant

## 2019-04-08 ENCOUNTER — Telehealth: Payer: Self-pay | Admitting: Family Medicine

## 2019-04-08 MED ORDER — CYCLOBENZAPRINE HCL 10 MG PO TABS: 10.0000 mg | ORAL_TABLET | Freq: Three times a day (TID) | ORAL | 0 refills | Status: DC | PRN

## 2019-04-08 MED ORDER — NAPROXEN 500 MG PO TABS: 500.0000 mg | ORAL_TABLET | Freq: Two times a day (BID) | ORAL | 0 refills | Status: DC

## 2019-04-08 NOTE — Telephone Encounter (Signed)
Pt has been scheduled.  °

## 2019-04-08 NOTE — Telephone Encounter (Signed)
Pt states that he has done something to his back again and asking if he could have the referral that was discuss back in Oct. Or is another OV needed. Please advise.

## 2019-04-08 NOTE — Progress Notes (Signed)
Pt's message is intended for his primary care provider for referral to specialist. He asked the medications send to the pharmacy to be cancelled. Called pharmacy and cancelled medications.

## 2019-04-08 NOTE — Addendum Note (Signed)
Addended by: Waldon Merl on: 04/08/2019 10:00 AM   Modules accepted: Orders

## 2019-04-08 NOTE — Progress Notes (Signed)

## 2019-04-08 NOTE — Telephone Encounter (Signed)
Pt would need a new appt. If wanting today could see Einar Pheasant in a VV

## 2019-04-11 ENCOUNTER — Encounter: Payer: Self-pay | Admitting: Family Medicine

## 2019-04-11 ENCOUNTER — Ambulatory Visit (INDEPENDENT_AMBULATORY_CARE_PROVIDER_SITE_OTHER): Payer: 59 | Admitting: Family Medicine

## 2019-04-11 ENCOUNTER — Other Ambulatory Visit: Payer: Self-pay

## 2019-04-11 ENCOUNTER — Ambulatory Visit (INDEPENDENT_AMBULATORY_CARE_PROVIDER_SITE_OTHER): Payer: 59

## 2019-04-11 VITALS — BP 133/89 | HR 90 | Temp 97.9°F | Resp 16 | Ht 68.0 in | Wt 201.5 lb

## 2019-04-11 DIAGNOSIS — Z0000 Encounter for general adult medical examination without abnormal findings: Secondary | ICD-10-CM

## 2019-04-11 DIAGNOSIS — Z125 Encounter for screening for malignant neoplasm of prostate: Secondary | ICD-10-CM | POA: Diagnosis not present

## 2019-04-11 DIAGNOSIS — Z0001 Encounter for general adult medical examination with abnormal findings: Secondary | ICD-10-CM

## 2019-04-11 DIAGNOSIS — E669 Obesity, unspecified: Secondary | ICD-10-CM

## 2019-04-11 DIAGNOSIS — M545 Low back pain, unspecified: Secondary | ICD-10-CM

## 2019-04-11 MED ORDER — PREDNISONE 10 MG PO TABS: ORAL_TABLET | ORAL | 0 refills | Status: DC

## 2019-04-11 MED ORDER — CYCLOBENZAPRINE HCL 10 MG PO TABS: 10.0000 mg | ORAL_TABLET | Freq: Three times a day (TID) | ORAL | 0 refills | Status: DC | PRN

## 2019-04-11 NOTE — Assessment & Plan Note (Signed)
Ongoing issue for pt.  Stressed need for healthy diet and regular exercise.  Given family hx of CAD, will get EKG today as baseline and consider stress test based on labs.  Pt expressed understanding and is in agreement w/ plan.

## 2019-04-11 NOTE — Progress Notes (Signed)
   Subjective:    Patient ID: Randy Henderson, male    DOB: December 18, 1955, 63 y.o.   MRN: [id number]  HPI CPE- UTD on colonoscopy, immunizations.   Review of Systems Patient reports no vision/hearing changes, anorexia, fever ,adenopathy, persistant/recurrent hoarseness, swallowing issues, chest pain, palpitations, edema, persistant/recurrent cough, hemoptysis, dyspnea (rest,exertional, paroxysmal nocturnal), gastrointestinal  bleeding (melena, rectal bleeding), abdominal pain, excessive heart burn, GU symptoms (dysuria, hematuria, voiding/incontinence issues) syncope, focal weakness, memory loss, numbness & tingling, skin/hair/nail changes, depression, anxiety, abnormal bruising/bleeding.   + sciatica x3 weeks, R sided.  Worse w/ standing, walking.  Pain is radiating to ankle.  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    Objective:   Physical Exam General Appearance:    Alert, cooperative, no distress, appears stated age  Head:    Normocephalic, without obvious abnormality, atraumatic  Eyes:    PERRL, conjunctiva/corneas clear, EOM's intact, fundi    benign, both eyes       Ears:    Normal TM's and external ear canals, both ears  Nose:   Nares normal, septum midline, mucosa normal, no drainage   or sinus tenderness  Throat:   Lips, mucosa, and tongue normal; teeth and gums normal  Neck:   Supple, symmetrical, trachea midline, no adenopathy;       thyroid:  No enlargement/tenderness/nodules  Back:     Symmetric, no curvature, ROM normal, no CVA tenderness  Lungs:     Clear to auscultation bilaterally, respirations unlabored  Chest wall:    No tenderness or deformity  Heart:    Regular rate and rhythm, S1 and S2 normal, no murmur, rub   or gallop  Abdomen:     Soft, non-tender, bowel sounds active all four quadrants,    no masses, no organomegaly  Genitalia:   deferred due to back pain  Rectal:    Extremities:   Extremities normal, atraumatic,  no cyanosis or edema  Pulses:   2+ and symmetric all extremities  Skin:   Skin color, texture, turgor normal, no rashes or lesions  Lymph nodes:   Cervical, supraclavicular, and axillary nodes normal  Neurologic:   CNII-XII intact. Normal strength, sensation and reflexes      Throughout.  + SLR bilaterally          Assessment & Plan:

## 2019-04-11 NOTE — Assessment & Plan Note (Signed)
Recurrent issue for pt.  Sxs and PE consistent w/ sciatica.  Start Prednisone and Flexeril.  Reviewed supportive care and red flags that should prompt return.  Pt expressed understanding and is in agreement w/ plan.

## 2019-04-11 NOTE — Addendum Note (Signed)
Addended by: Doran Clay A on: 04/11/2019 02:57 PM   Modules accepted: Orders

## 2019-04-11 NOTE — Patient Instructions (Signed)
Follow up in 1 year or as needed We'll notify you of your lab results and make any changes if needed Continue to work on healthy diet and regular exercise- you can do it! START the Prednisone as directed- take w/ food USE the Cyclobenzaprine at night and as needed for muscle spasm- may cause drowsiness Call with any questions or concerns Stay Safe!

## 2019-04-11 NOTE — Assessment & Plan Note (Signed)
Pt's PE WNL w/ exception of obesity and R sided sciatica.  Encouraged healthy diet and regular exercise.  Check labs.  Anticipatory guidance provided.

## 2019-04-12 ENCOUNTER — Other Ambulatory Visit: Payer: Self-pay | Admitting: Family Medicine

## 2019-04-12 DIAGNOSIS — E781 Pure hyperglyceridemia: Secondary | ICD-10-CM

## 2019-04-12 DIAGNOSIS — Z8249 Family history of ischemic heart disease and other diseases of the circulatory system: Secondary | ICD-10-CM

## 2019-04-12 DIAGNOSIS — R972 Elevated prostate specific antigen [PSA]: Secondary | ICD-10-CM

## 2019-04-12 DIAGNOSIS — E669 Obesity, unspecified: Secondary | ICD-10-CM

## 2019-04-12 LAB — CBC WITH DIFFERENTIAL/PLATELET
Basophils Absolute: 0.1 10*3/uL (ref 0.0–0.1)
Basophils Relative: 0.9 % (ref 0.0–3.0)
Eosinophils Absolute: 0.1 10*3/uL (ref 0.0–0.7)
Eosinophils Relative: 1.4 % (ref 0.0–5.0)
HCT: 44 % (ref 39.0–52.0)
Hemoglobin: 14.6 g/dL (ref 13.0–17.0)
Lymphocytes Relative: 35.4 % (ref 12.0–46.0)
Lymphs Abs: 2.3 10*3/uL (ref 0.7–4.0)
MCHC: 33.2 g/dL (ref 30.0–36.0)
MCV: 91.5 fl (ref 78.0–100.0)
Monocytes Absolute: 0.7 10*3/uL (ref 0.1–1.0)
Monocytes Relative: 10.3 % (ref 3.0–12.0)
Neutro Abs: 3.4 10*3/uL (ref 1.4–7.7)
Neutrophils Relative %: 52 % (ref 43.0–77.0)
Platelets: 213 10*3/uL (ref 150.0–400.0)
RBC: 4.81 Mil/uL (ref 4.22–5.81)
RDW: 12.8 % (ref 11.5–15.5)
WBC: 6.6 10*3/uL (ref 4.0–10.5)

## 2019-04-12 LAB — HEPATIC FUNCTION PANEL
ALT: 15 U/L (ref 0–53)
AST: 17 U/L (ref 0–37)
Albumin: 4.6 g/dL (ref 3.5–5.2)
Alkaline Phosphatase: 65 U/L (ref 39–117)
Bilirubin, Direct: 0.1 mg/dL (ref 0.0–0.3)
Total Bilirubin: 0.7 mg/dL (ref 0.2–1.2)
Total Protein: 7.5 g/dL (ref 6.0–8.3)

## 2019-04-12 LAB — LIPID PANEL
Cholesterol: 161 mg/dL (ref 0–200)
HDL: 38.8 mg/dL — ABNORMAL LOW (ref >39.00)
NonHDL: 122.23
Total CHOL/HDL Ratio: 4
Triglycerides: 247 mg/dL — ABNORMAL HIGH (ref 0.0–149.0)
VLDL: 49.4 mg/dL — ABNORMAL HIGH (ref 0.0–40.0)

## 2019-04-12 LAB — BASIC METABOLIC PANEL
BUN: 19 mg/dL (ref 6–23)
CO2: 28 mEq/L (ref 19–32)
Calcium: 9.9 mg/dL (ref 8.4–10.5)
Chloride: 102 mEq/L (ref 96–112)
Creatinine, Ser: 1.1 mg/dL (ref 0.40–1.50)
GFR: 67.37 mL/min (ref >60.00)
Glucose, Bld: 78 mg/dL (ref 70–99)
Potassium: 4.3 mEq/L (ref 3.5–5.1)
Sodium: 138 mEq/L (ref 135–145)

## 2019-04-12 LAB — PSA: PSA: 4.35 ng/mL — ABNORMAL HIGH (ref 0.10–4.00)

## 2019-04-12 LAB — TSH: TSH: 1.48 u[IU]/mL (ref 0.35–4.50)

## 2019-04-12 LAB — LDL CHOLESTEROL, DIRECT: Direct LDL: 80 mg/dL

## 2019-04-12 NOTE — Addendum Note (Signed)
Addended by: Midge Minium on: 04/12/2019 04:15 PM   Modules accepted: Orders

## 2019-04-16 ENCOUNTER — Telehealth: Payer: Self-pay

## 2019-04-16 ENCOUNTER — Telehealth: Payer: Self-pay | Admitting: General Practice

## 2019-04-16 NOTE — Telephone Encounter (Deleted)
Patient called, left VM to return the call to [phone] between the hours 0700-1900 to schedule covid testing.

## 2019-04-16 NOTE — Telephone Encounter (Signed)
This encounter was created in error - please disregard.

## 2019-04-16 NOTE — Addendum Note (Signed)
Addended by: Matilde Sprang on: 04/16/2019 02:56 PM   Modules accepted: Level of Service, SmartSet

## 2019-04-16 NOTE — Telephone Encounter (Signed)
Patient called, left VM to return the call to [phone] between the hours 0700-1900 to schedule covid testing.

## 2019-04-16 NOTE — Telephone Encounter (Signed)
Copied from [facility] [id number]. Topic: General - Inquiry >> Apr 16, 2019 12:23 PM Richardo Priest, NT wrote: Reason for CRM: Gesala with [facility] called in requesting that the patient be tested for COVID prior to having their appointment the 19th. Please advise.

## 2019-04-19 ENCOUNTER — Ambulatory Visit (HOSPITAL_COMMUNITY)
Admission: RE | Admit: 2019-04-19 | Payer: 59 | Source: Ambulatory Visit | Attending: Family Medicine | Admitting: Family Medicine

## 2019-04-20 ENCOUNTER — Encounter: Payer: Self-pay | Admitting: Family Medicine

## 2019-04-20 DIAGNOSIS — M5410 Radiculopathy, site unspecified: Secondary | ICD-10-CM

## 2019-04-20 DIAGNOSIS — M545 Low back pain, unspecified: Secondary | ICD-10-CM

## 2019-04-30 ENCOUNTER — Other Ambulatory Visit: Payer: Self-pay

## 2019-04-30 ENCOUNTER — Encounter: Payer: Self-pay | Admitting: Family Medicine

## 2019-04-30 ENCOUNTER — Ambulatory Visit (INDEPENDENT_AMBULATORY_CARE_PROVIDER_SITE_OTHER): Payer: 59 | Admitting: Family Medicine

## 2019-04-30 DIAGNOSIS — M5441 Lumbago with sciatica, right side: Secondary | ICD-10-CM

## 2019-04-30 DIAGNOSIS — G8929 Other chronic pain: Secondary | ICD-10-CM

## 2019-04-30 MED ORDER — NABUMETONE 750 MG PO TABS: 750.0000 mg | ORAL_TABLET | Freq: Two times a day (BID) | ORAL | 6 refills | Status: DC | PRN

## 2019-04-30 NOTE — Progress Notes (Signed)
I saw and examined the patient with Dr. Okey Dupre and agree with assessment and plan as outlined.  LBP with right-sided sciatica, non-focal exam.  Will try PT, ESI.  If still not better, then MRI.

## 2019-04-30 NOTE — Progress Notes (Signed)
  Randy Henderson - 64 y.o. male MRN [id number]  Date of birth: 11/05/1955    SUBJECTIVE:      Chief Complaint: low back pain  HPI:  63 year old male with history of low back pain.  Initially he had pain isolated to his low sacral area for about the past year.  Now, over the past 4-1/2 months the pain has worsened and is radiating down his right leg.  He reports radiation into the buttock, lateral thigh crossing over the shin and into the medial aspect of the foot.  He also reports numbness in the same distribution.  He denies any specific injury that worsen his pain, but feels it was due to doing more yard work recently.  He recently saw his PCP who obtain x-rays, started him on prednisone for 1 week as well as Flexeril.  He denies any benefit from the medications.  He does, however, get some relief with ibuprofen.  He denies any focal weakness.  He denies any bowel or bladder symptoms.  No saddle anesthesia.  No recent fevers or chills.   ROS:     See HPI. All other reviewed systems negative.  PERTINENT  PMH / PSH FH / / SH:  Past Medical, Surgical, Social, and Family History Reviewed & Updated in the EMR.    OBJECTIVE: There were no vitals taken for this visit.  Physical Exam:  Vital signs are reviewed.  GEN: Alert and oriented, NAD Pulm: Breathing unlabored PSY: normal mood, congruent affect  MSK: Lumbar spine: - Inspection: no gross deformity or asymmetry, swelling or ecchymosis. No skin changes - Palpation: Midline tenderness over L4/L5.  He is more tender over the right paraspinal musculature in this area. - ROM: full active ROM of the lumbar spine in flexion and extension with some discomfort. - Strength: 5/5 strength of lower extremity in L4-S1 nerve root distributions b/l - Neuro: sensation intact in the L4-S1 nerve root distribution b/l, 2+ L4 and S1 reflexes - Special testing: Positive straight leg raise on the right  Right Hip: Mild TTP over GT 5/5 hip strength. Full  ROM.  NV intact  Left Hip: 5/5 strength with full ROM NV intact  ASSESSMENT & PLAN:  1. Low back pain with radiculopathy, right L4 nerve root. No focal weakness or red flag symptoms. 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 If symptoms persisting, consider MRI

## 2019-05-07 ENCOUNTER — Encounter: Payer: Self-pay | Admitting: Family Medicine

## 2019-05-27 ENCOUNTER — Telehealth (HOSPITAL_COMMUNITY): Payer: Self-pay | Admitting: Radiology

## 2019-05-27 NOTE — Telephone Encounter (Signed)
Left message to call office-Patient needs to schedule a stress echocardiogram. Please transfer to department to get patient scheduled.

## 2019-05-29 ENCOUNTER — Encounter: Payer: Self-pay | Admitting: Family Medicine

## 2019-05-29 DIAGNOSIS — G8929 Other chronic pain: Secondary | ICD-10-CM

## 2019-06-10 ENCOUNTER — Ambulatory Visit (INDEPENDENT_AMBULATORY_CARE_PROVIDER_SITE_OTHER): Payer: 59 | Admitting: Physical Medicine and Rehabilitation

## 2019-06-10 ENCOUNTER — Ambulatory Visit: Payer: Self-pay

## 2019-06-10 ENCOUNTER — Encounter: Payer: Self-pay | Admitting: Physical Medicine and Rehabilitation

## 2019-06-10 VITALS — BP 156/102 | HR 89

## 2019-06-10 DIAGNOSIS — M5416 Radiculopathy, lumbar region: Secondary | ICD-10-CM

## 2019-06-10 MED ORDER — BETAMETHASONE SOD PHOS & ACET 6 (3-3) MG/ML IJ SUSP
12.0000 mg | Freq: Once | INTRAMUSCULAR | Status: AC
  Administered 2019-06-10: 12 mg

## 2019-06-10 NOTE — Progress Notes (Signed)
 .  Numeric Pain Rating Scale and Functional Assessment Average Pain 8   In the last MONTH (on 0-10 scale) has pain interfered with the following?  1. General activity like being  able to carry out your everyday physical activities such as walking, climbing stairs, carrying groceries, or moving a chair?  Rating(7)   +Driver, -BT, -Dye Allergies.  

## 2019-06-11 ENCOUNTER — Telehealth: Payer: Self-pay | Admitting: Family Medicine

## 2019-06-11 NOTE — Telephone Encounter (Signed)
Lumbar MRI was previously reviewed with patient by Dr. 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  He had an ESI yesterday.  He'll follow up based on his response.

## 2019-06-11 NOTE — Procedures (Signed)
Lumbosacral Transforaminal Epidural Steroid Injection - Sub-Pedicular Approach with Fluoroscopic Guidance  Patient: Randy Henderson      Date of Birth: 25-Dec-1955 MRN: [id number] PCP: Midge Minium, MD      Visit Date: 06/10/2019   Universal Protocol:    Date/Time: 06/10/2019  Consent Given By: the patient  Position: PRONE  Additional Comments: Vital signs were monitored before and after the procedure. Patient was prepped and draped in the usual sterile fashion. The correct patient, procedure, and site was verified.   Injection Procedure Details:  Procedure Site One Meds Administered:  Meds ordered this encounter  Medications  . betamethasone acetate-betamethasone sodium phosphate (CELESTONE) injection 12 mg    Laterality: Right  Location/Site:  L5-S1  Needle size: 22 G  Needle type: Spinal  Needle Placement: Transforaminal  Findings:    -Comments: Excellent flow of contrast along the nerve and into the epidural space.  Procedure Details: After squaring off the end-plates to get a true AP view, the C-arm was positioned so that an oblique view of the foramen as noted above was visualized. The target area is just inferior to the "nose of the scotty dog" or sub pedicular. The soft tissues overlying this structure were infiltrated with 2-3 ml. of 1% Lidocaine without Epinephrine.  The spinal needle was inserted toward the target using a "trajectory" view along the fluoroscope beam.  Under AP and lateral visualization, the needle was advanced so it did not puncture dura and was located close the 6 O'Clock position of the pedical in AP tracterory. Biplanar projections were used to confirm position. Aspiration was confirmed to be negative for CSF and/or blood. A 1-2 ml. volume of Isovue-250 was injected and flow of contrast was noted at each level. Radiographs were obtained for documentation purposes.   After attaining the desired flow of contrast documented above, a 0.5  to 1.0 ml test dose of 0.25% Marcaine was injected into each respective transforaminal space.  The patient was observed for 90 seconds post injection.  After no sensory deficits were reported, and normal lower extremity motor function was noted,   the above injectate was administered so that equal amounts of the injectate were placed at each foramen (level) into the transforaminal epidural space.   Additional Comments:  The patient tolerated the procedure well Dressing: 2 x 2 sterile gauze and Band-Aid    Post-procedure details: Patient was observed during the procedure. Post-procedure instructions were reviewed.  Patient left the clinic in stable condition.

## 2019-06-11 NOTE — Progress Notes (Signed)
Randy Henderson - 63 y.o. male MRN [id number]  Date of birth: Sep 14, 1956  Office Visit Note: Visit Date: 06/10/2019 PCP: Midge Minium, MD Referred by: Midge Minium, MD  Subjective: Chief Complaint  Patient presents with   Lower Back - Pain   HPI:  Randy Henderson is a 63 y.o. male who comes in today For planned right L4 transforaminal injection as requested by Dr. Eunice Blase.  According to the notes the patient was having radicular leg pain on the right for several weeks now, approximately 9 weeks.  He really has failed conservative care with medication management exercise etc.  He reports worsening with standing and walking and better at rest.  The symptoms were described as posterior lateral hip into the thigh and across the knee and then the medial side of the leg.  This would be a pretty classic L4 distribution.  Epidural was requested and depending on relief MRI was going to be ordered.  Evidently MRI was actually ordered before he came in he has had the MRI completed.  I did go over the MRI with him using spine models and imaging.  This MRI is reviewed below.  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  This could clearly give him some L5 type symptoms or below but the L4 nerve root escapes pretty well.  He also has central disc protrusion here as well.  At L5-S1 there is a right-sided paracentral protrusion that could affect the S1 nerve root but no focal compression.  Patient symptoms are as classic L5 symptoms as really have seen.  He gets symptoms posterior lateral leg to the knee but really into the anterior lateral shin and occasionally the ankle which is pretty classic for the L5 distribution.  I think given those findings and after I talked with the patient we are going to complete an L5 transforaminal injection.   Depending on relief would look at repeat injection or intermittent injections and hopefully over time if the disc protrusion is what is causing some of the issue that will regress.  He would likely do well potentially with different medication trial but I think given his age and activity at 89 he might do well with lumbar decompression.  There is no listhesis and I doubt he would need any sort of fusion procedure.  ROS Otherwise per HPI.  Assessment & Plan: Visit Diagnoses:  1. Lumbar radiculopathy     Plan: No additional findings.   Meds & Orders:  Meds ordered this encounter  Medications   betamethasone acetate-betamethasone sodium phosphate (CELESTONE) injection 12 mg    Orders Placed This Encounter  Procedures   XR C-ARM NO REPORT   Epidural Steroid injection    Follow-up: Return if symptoms worsen or fail to improve.   Procedures: No procedures performed  Lumbosacral Transforaminal Epidural Steroid Injection - Sub-Pedicular Approach with Fluoroscopic Guidance  Patient: Randy Henderson      Date of Birth: May 05, 1956 MRN: [id number] PCP: Midge Minium, MD      Visit Date: 06/10/2019   Universal Protocol:    Date/Time: 06/10/2019  Consent Given By: the patient  Position: PRONE  Additional Comments: Vital signs were monitored before and after the procedure. Patient was prepped and draped in the usual sterile fashion. The correct patient, procedure, and site was verified.   Injection Procedure  Details:  Procedure Site One Meds Administered:  Meds ordered this encounter  Medications   betamethasone acetate-betamethasone sodium phosphate (CELESTONE) injection 12 mg    Laterality: Right  Location/Site:  L5-S1  Needle size: 22 G  Needle type: Spinal  Needle Placement: Transforaminal  Findings:    -Comments: Excellent flow of contrast along the nerve and into the epidural space.  Procedure Details: After squaring off the end-plates to get a true  AP view, the C-arm was positioned so that an oblique view of the foramen as noted above was visualized. The target area is just inferior to the "nose of the scotty dog" or sub pedicular. The soft tissues overlying this structure were infiltrated with 2-3 ml. of 1% Lidocaine without Epinephrine.  The spinal needle was inserted toward the target using a "trajectory" view along the fluoroscope beam.  Under AP and lateral visualization, the needle was advanced so it did not puncture dura and was located close the 6 O'Clock position of the pedical in AP tracterory. Biplanar projections were used to confirm position. Aspiration was confirmed to be negative for CSF and/or blood. A 1-2 ml. volume of Isovue-250 was injected and flow of contrast was noted at each level. Radiographs were obtained for documentation purposes.   After attaining the desired flow of contrast documented above, a 0.5 to 1.0 ml test dose of 0.25% Marcaine was injected into each respective transforaminal space.  The patient was observed for 90 seconds post injection.  After no sensory deficits were reported, and normal lower extremity motor function was noted,   the above injectate was administered so that equal amounts of the injectate were placed at each foramen (level) into the transforaminal epidural space.   Additional Comments:  The patient tolerated the procedure well Dressing: 2 x 2 sterile gauze and Band-Aid    Post-procedure details: Patient was observed during the procedure. Post-procedure instructions were reviewed.  Patient left the clinic in stable condition.    Clinical History: MRI LUMBAR SPINE WITHOUT CONTRAST  TECHNIQUE: Multiplanar, multisequence MR imaging of the lumbar spine was performed. No intravenous contrast was administered.  COMPARISON: Lumbar radiographs 08/16/2018  FINDINGS: Segmentation: Normal  Alignment: Slight retrolisthesis L2-3. Remaining alignment normal.  Vertebrae: Negative for  fracture or mass. Large hemangioma L4 vertebral body.  Conus medullaris and cauda equina: Conus extends to the L1-2 level. Conus and cauda equina appear normal.  Paraspinal and other soft tissues: Negative for paraspinous mass or adenopathy. No soft tissue edema. Aorta normal in caliber.  Disc levels:  Congenitally small spinal canal with superimposed degenerative change at multiple levels.  L1-2: Mild disc bulging and mild facet degeneration. No significant stenosis  L2-3: Diffuse disc bulging and mild facet degeneration. Mild spinal stenosis. Small left-sided disc protrusion.  L3-4: Mild disc bulging and mild facet degeneration. Mild spinal stenosis  L4-5: Severe spinal stenosis. Central disc protrusion as well as diffuse endplate spurring and bilateral facet degeneration contribute to stenosis. Severe subarticular stenosis bilaterally.  L5-S1: Moderate subarticular stenosis on the right due to spurring. Impingement right S1 nerve root.  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   Electronically Signed By: Franchot Gallo M.D. On: 06/09/2019 14:19     Objective:  VS:  HT:     WT:    BMI:      BP:(!)  156/102   HR:89bpm   TEMP: ( )   RESP:  Physical Exam  Ortho Exam Imaging: Xr C-arm No Report  Result Date: 06/10/2019 Please see Notes tab for imaging impression.

## 2019-07-10 ENCOUNTER — Encounter: Payer: 59 | Admitting: Family Medicine

## 2019-10-03 ENCOUNTER — Encounter: Payer: Self-pay | Admitting: Family Medicine

## 2019-12-07 ENCOUNTER — Ambulatory Visit: Payer: 59 | Attending: Internal Medicine

## 2019-12-07 DIAGNOSIS — Z23 Encounter for immunization: Secondary | ICD-10-CM

## 2019-12-07 NOTE — Progress Notes (Signed)
   Covid-19 Vaccination Clinic  Name:  Randy Henderson    MRN: [id number] DOB: August 28, 1956  12/07/2019  [name] was observed post Covid-19 immunization for 15 minutes without incidence. He was provided with Vaccine Information Sheet and instruction to access the V-Safe system.   [name] was instructed to call 911 with any severe reactions post vaccine: Marland Kitchen Difficulty breathing  . Swelling of your face and throat  . A fast heartbeat  . A bad rash all over your body  . Dizziness and weakness    Immunizations Administered    Name Date Dose VIS Date Route   Pfizer COVID-19 Vaccine 12/07/2019 10:55 AM 0.3 mL 10/11/2019 Intramuscular   Manufacturer: [place]   Lot: [id number]   [place]: [id number]

## 2019-12-10 ENCOUNTER — Emergency Department (HOSPITAL_COMMUNITY): Payer: 59

## 2019-12-10 ENCOUNTER — Other Ambulatory Visit: Payer: Self-pay

## 2019-12-10 ENCOUNTER — Encounter (HOSPITAL_COMMUNITY): Payer: Self-pay | Admitting: Emergency Medicine

## 2019-12-10 ENCOUNTER — Emergency Department (HOSPITAL_COMMUNITY)
Admission: EM | Admit: 2019-12-10 | Discharge: 2019-12-11 | Disposition: A | Discharge status: Home / Self Care | Payer: 59 | Attending: Emergency Medicine | Admitting: Emergency Medicine

## 2019-12-10 DIAGNOSIS — Y939 Activity, unspecified: Secondary | ICD-10-CM | POA: Diagnosis not present

## 2019-12-10 DIAGNOSIS — Z87891 Personal history of nicotine dependence: Secondary | ICD-10-CM | POA: Diagnosis not present

## 2019-12-10 DIAGNOSIS — M7022 Olecranon bursitis, left elbow: Secondary | ICD-10-CM | POA: Diagnosis not present

## 2019-12-10 DIAGNOSIS — Z79899 Other long term (current) drug therapy: Secondary | ICD-10-CM | POA: Insufficient documentation

## 2019-12-10 DIAGNOSIS — R0789 Other chest pain: Secondary | ICD-10-CM

## 2019-12-10 LAB — PROTIME-INR
INR: 0.9 (ref 0.8–1.2)
Prothrombin Time: 12.4 seconds (ref 11.4–15.2)

## 2019-12-10 LAB — CBC
HCT: 44.2 % (ref 39.0–52.0)
Hemoglobin: 14.7 g/dL (ref 13.0–17.0)
MCH: 30.4 pg (ref 26.0–34.0)
MCHC: 33.3 g/dL (ref 30.0–36.0)
MCV: 91.3 fL (ref 80.0–100.0)
Platelets: 216 10*3/uL (ref 150–400)
RBC: 4.84 MIL/uL (ref 4.22–5.81)
RDW: 12 % (ref 11.5–15.5)
WBC: 10.7 10*3/uL — ABNORMAL HIGH (ref 4.0–10.5)
nRBC: 0 % (ref 0.0–0.2)

## 2019-12-10 LAB — BASIC METABOLIC PANEL
Anion gap: 10 (ref 5–15)
BUN: 17 mg/dL (ref 8–23)
CO2: 28 mmol/L (ref 22–32)
Calcium: 9.5 mg/dL (ref 8.9–10.3)
Chloride: 102 mmol/L (ref 98–111)
Creatinine, Ser: 1.02 mg/dL (ref 0.61–1.24)
GFR calc Af Amer: >60 mL/min (ref >60)
GFR calc non Af Amer: >60 mL/min (ref >60)
Glucose, Bld: 148 mg/dL — ABNORMAL HIGH (ref 70–99)
Potassium: 3.9 mmol/L (ref 3.5–5.1)
Sodium: 140 mmol/L (ref 135–145)

## 2019-12-10 LAB — TROPONIN I (HIGH SENSITIVITY): Troponin I (High Sensitivity): 5 ng/L (ref <18)

## 2019-12-10 MED ORDER — SODIUM CHLORIDE 0.9% FLUSH: 3.0000 mL | Freq: Once | INTRAVENOUS | Status: DC

## 2019-12-10 NOTE — ED Triage Notes (Signed)
Patient reports intermittent left chest pain onset last Sunday with mild nausea , denies SOB or diaphoresis . Pain radiates to left arm and left face , no cough or fever.

## 2019-12-11 LAB — D-DIMER, QUANTITATIVE: D-Dimer, Quant: <0.27 ug/mL-FEU (ref 0.00–0.50)

## 2019-12-11 LAB — TROPONIN I (HIGH SENSITIVITY): Troponin I (High Sensitivity): 6 ng/L (ref <18)

## 2019-12-11 MED ORDER — KETOROLAC TROMETHAMINE 30 MG/ML IJ SOLN
30.0000 mg | Freq: Once | INTRAMUSCULAR | Status: AC
  Administered 2019-12-11: 30 mg via INTRAVENOUS
  Filled 2019-12-11: qty 1

## 2019-12-11 MED ORDER — MELOXICAM 15 MG PO TABS: 15.0000 mg | ORAL_TABLET | Freq: Every day | ORAL | 0 refills | Status: DC

## 2019-12-11 NOTE — ED Provider Notes (Signed)
TIME SEEN: 2:21 AM  CHIEF COMPLAINT: Chest pain, left elbow pain  HPI: Patient is a 64 year old male with history of gout, hypertriglyceridemia who presents to the emergency department with complaints of 2 days of intermittent chest pain.  Describes it as a "stabbing" and " crushing" pain.  States he feels it goes into the left upper extremity.  Symptoms were intermittent yesterday but more constant today.  No associated shortness of breath, vomiting, dizziness, diaphoresis.  Pain is not pleuritic or exertional.  Not related to eating.  It is somewhat worse with palpation of the chest but not worse with movement of the arm.  He has never had similar symptoms.  No history of stress test or cardiac catheterization.  His mother did have a heart attack at age 96.  Reports he is able to walk at least a mile at work every day and never have symptoms like this.  Exertion did not make his symptoms worse.  Patient also complains of left elbow pain.  He has noticed that it has become swollen today.  Has had history of bursitis in March 2019 that resolved on its own.  This area is red and warm but he denies any fevers.  No injury.  He has not been doing any work on his elbows.  He has had a history of gout but not in this area before.    ROS: See HPI Constitutional: no fever  Eyes: no drainage  ENT: no runny nose   Cardiovascular:   chest pain  Resp: no SOB  GI: no vomiting GU: no dysuria Integumentary: no rash  Allergy: no hives  Musculoskeletal: no leg swelling  Neurological: no slurred speech ROS otherwise negative  PAST MEDICAL HISTORY/PAST SURGICAL HISTORY:  Past Medical History:  Diagnosis Date  . Gout   . History of chicken pox   . Hx of adenomatous polyp of colon 10/14/2018    MEDICATIONS:  Prior to Admission medications   Medication Sig Start Date End Date Taking? Authorizing Provider  cyclobenzaprine (FLEXERIL) 10 MG tablet Take 1 tablet (10 mg total) by mouth 3 (three) times daily  as needed for muscle spasms. 04/11/19   Midge Minium, MD  nabumetone (RELAFEN) 750 MG tablet Take 1 tablet (750 mg total) by mouth 2 (two) times daily as needed. 04/30/19   Hilts, Legrand Como, MD  predniSONE (DELTASONE) 10 MG tablet 3 tabs x3 days and then 2 tabs x3 days and then 1 tab x3 days.  Take w/ food. 04/11/19   Midge Minium, MD    ALLERGIES:  No Known Allergies  SOCIAL HISTORY:  Social History   Tobacco Use  . Smoking status: Former Smoker    Packs/day: 0.00    Quit date: 08/16/2006    Years since quitting: 13.3  . Smokeless tobacco: Never Used  Substance Use Topics  . Alcohol use: Yes    Comment: 7 q wk    FAMILY HISTORY: Family History  Problem Relation Age of Onset  . Heart failure Mother   . Arthritis Mother   . Heart disease Mother   . Heart attack Mother   . Stroke Father   . Colon polyps Father   . Early death Sister        Mad Cow disease  . Colon polyps Sister   . Heart attack Maternal Grandfather   . Heart attack Paternal Grandfather   . Colon cancer Neg Hx   . Esophageal cancer Neg Hx   . Rectal cancer Neg  Hx   . Stomach cancer Neg Hx     EXAM: BP 135/83   Pulse 80   Temp 98.5 F (36.9 C) (Oral)   Resp 20   SpO2 96%  CONSTITUTIONAL: Alert and oriented and responds appropriately to questions. Well-appearing; well-nourished HEAD: Normocephalic EYES: Conjunctivae clear, pupils appear equal, EOM appear intact ENT: normal nose; moist mucous membranes NECK: Supple, normal ROM CARD: RRR; S1 and S2 appreciated; no murmurs, no clicks, no rubs, no gallops CHEST:  Chest wall is tender to palpation over the left anterior chest wall which reproduces some of his pain.  No crepitus, ecchymosis, erythema, warmth, rash or other lesions present.   RESP: Normal chest excursion without splinting or tachypnea; breath sounds clear and equal bilaterally; no wheezes, no rhonchi, no rales, no hypoxia or respiratory distress, speaking full sentences ABD/GI:  Normal bowel sounds; non-distended; soft, non-tender, no rebound, no guarding, no peritoneal signs, no hepatosplenomegaly BACK:  The back appears normal EXT: Normal ROM in all joints; no deformity noted, no edema; no cyanosis.  Patient does have small amount of soft tissue swelling over the left olecranon bursa with a little bit of redness and warmth.  He is a 2+ left radial pulse and compartments in the left arm are soft.  Normal sensation throughout the left arm and he has normal range of motion in the left elbow without joint effusion.  No bony deformity noted. SKIN: Normal color for age and race; warm; no rash on exposed skin NEURO: Moves all extremities equally PSYCH: The patient's mood and manner are appropriate.   MEDICAL DECISION MAKING: Patient here with atypical chest pain.  His EKG shows no ischemic change.  He has had 2 troponins that have been normal and a D-dimer that is negative.  His chest x-ray is clear.  This may be musculoskeletal in nature as I am able to reproduce it with palpation and he did have improvement with Toradol.  He reports he is able to walk a mile a day and this does not worsen or bring on his symptoms.  Low suspicion that this is ACS, PE or dissection.  His heart score is 2.  Recommended close follow-up with his PCP.  I feel he is safe for discharge home.  As for his left elbow, it appears he has olecranon bursitis.  It is slightly red and warm and I cannot rule out septic bursitis.  He has no joint effusion and has full range of motion in this joint so I doubt septic arthritis.  No injury to suggest fracture.  He has neurovascular intact distally.  No sign of compartment syndrome.  Doubt DVT.  No sign of cellulitis.  No sign of abscess.  I do not feel he needs an x-ray but have recommended that we tap the bursa to evaluate for infection.  He declines this at this time and would like to just try rest, ice and NSAIDs.  Will discharge with prescription of Mobic but have  discussed at length return precautions.  At this time, I do not feel there is any life-threatening condition present. I have reviewed, interpreted and discussed all results (EKG, imaging, lab, urine as appropriate) and exam findings with patient/family. I have reviewed nursing notes and appropriate previous records.  I feel the patient is safe to be discharged home without further emergent workup and can continue workup as an outpatient as needed. Discussed usual and customary return precautions. Patient/family verbalize understanding and are comfortable with this plan.  Outpatient  follow-up has been provided as needed. All questions have been answered.     EKG Interpretation  Date/Time:  Tuesday December 10 2019 21:09:42 EST Ventricular Rate:  89 PR Interval:  160 QRS Duration: 86 QT Interval:  360 QTC Calculation: 438 R Axis:   94 Text Interpretation: Normal sinus rhythm Rightward axis Cannot rule out Inferior infarct , age undetermined Abnormal ECG No old tracing to compare Confirmed by Erwin Nishiyama, Cyril Mourning [phone]) on 12/11/2019 1:25:09 AM            Reather Converse was evaluated in Emergency Department on 12/11/2019 for the symptoms described in the history of present illness. He was evaluated in the context of the global COVID-19 pandemic, which necessitated consideration that the patient might be at risk for infection with the SARS-CoV-2 virus that causes COVID-19. Institutional protocols and algorithms that pertain to the evaluation of patients at risk for COVID-19 are in a state of rapid change based on information released by regulatory bodies including the CDC and federal and state organizations. These policies and algorithms were followed during the patient's care in the ED.  Patient was seen wearing N95, face shield, gloves.    Silvie Obremski, Delice Bison, DO 12/11/19 [phone]

## 2019-12-11 NOTE — Discharge Instructions (Signed)
Your cardiac work-up today was normal including 2 normal cardiac labs, negative D-dimer, clear chest x-ray and normal EKG.  You do have some risk factors for heart disease although your risk is low.  I recommend close follow-up with your outpatient PCP.  You also appear to have bursitis of the left elbow.  I recommended a tap of this elbow to evaluate for gout versus infection given this area is red and warm which you have declined.  I recommend that you rest this arm, apply ice, keep it elevated and continue Mobic.  I recommend close follow-up with your primary care physician.  If this begins to become more swollen, more red or warm, more painful or you have fever, please return to the emergency department.

## 2020-01-06 ENCOUNTER — Ambulatory Visit: Payer: 59 | Attending: Internal Medicine

## 2020-01-06 DIAGNOSIS — Z23 Encounter for immunization: Secondary | ICD-10-CM | POA: Insufficient documentation

## 2020-01-06 NOTE — Progress Notes (Signed)
   Covid-19 Vaccination Clinic  Name:  Randy Henderson    MRN: [id number] DOB: 04-May-1956  01/06/2020  [name] was observed post Covid-19 immunization for 15 minutes without incident. He was provided with Vaccine Information Sheet and instruction to access the V-Safe system.   [name] was instructed to call 911 with any severe reactions post vaccine: Marland Kitchen Difficulty breathing  . Swelling of face and throat  . A fast heartbeat  . A bad rash all over body  . Dizziness and weakness   Immunizations Administered    Name Date Dose VIS Date Route   Pfizer COVID-19 Vaccine 01/06/2020  8:52 AM 0.3 mL 10/11/2019 Intramuscular   Manufacturer: [place]   Lot: [id number]   [place]: [id number]

## 2020-01-16 ENCOUNTER — Telehealth: Payer: Self-pay | Admitting: Radiology

## 2020-01-16 NOTE — Telephone Encounter (Signed)
Exercise treadmill test was ordered back in June 2020 when the treadmill room was shut down due to covid. Please advise if patient still needs the test scheduled. Currently patients are being asked to have covid screening 4 days prior and will be required to self quarantine until test.   If no longer need can you please cancel the order

## 2020-01-16 NOTE — Telephone Encounter (Signed)
I know pt is still interested in having this done.  Please reach out to him and give instructions/schedule

## 2020-01-17 NOTE — Telephone Encounter (Signed)
Thanks. Ill send to our scheduling department

## 2020-02-04 NOTE — Telephone Encounter (Signed)
Called and LMOVM with pt to find out why he canceled the stress test and to make sure he is feeling ok.

## 2020-02-04 NOTE — Telephone Encounter (Signed)
Can you please ask pt why he declined the stress test at this time?  He sent a MyChart indicating that he would like to proceed now that COVID restrictions were relaxing

## 2020-02-04 NOTE — Telephone Encounter (Signed)
Spoke with patient today after leaving 2 messages to call schedule test.   Patient has decline the test.   He will discuss this with his PC MD next time he see them.   Order will be removed.

## 2020-02-06 NOTE — Telephone Encounter (Signed)
Called pt again and no answer. Closing encounter until pt returns call.

## 2020-02-09 IMAGING — DX DG LUMBAR SPINE COMPLETE 4+V
5 series · 5 of 5 positions shown · non-contrast
Comparison: No prior.

CLINICAL DATA: Back pain.  Multiple lifting injuries.

EXAM:
LUMBAR SPINE - COMPLETE 4+ VIEW

[lumbar spine ap]
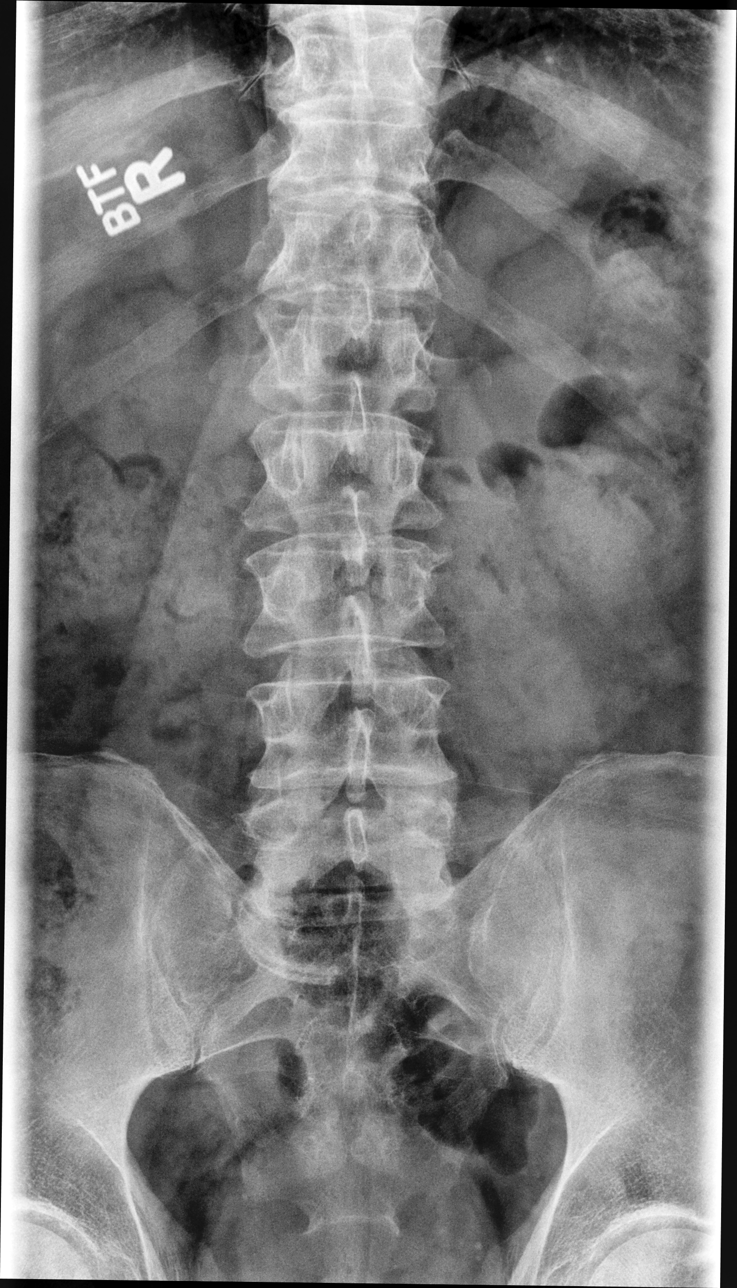

[lumbar spine oblique (1 of 2)]
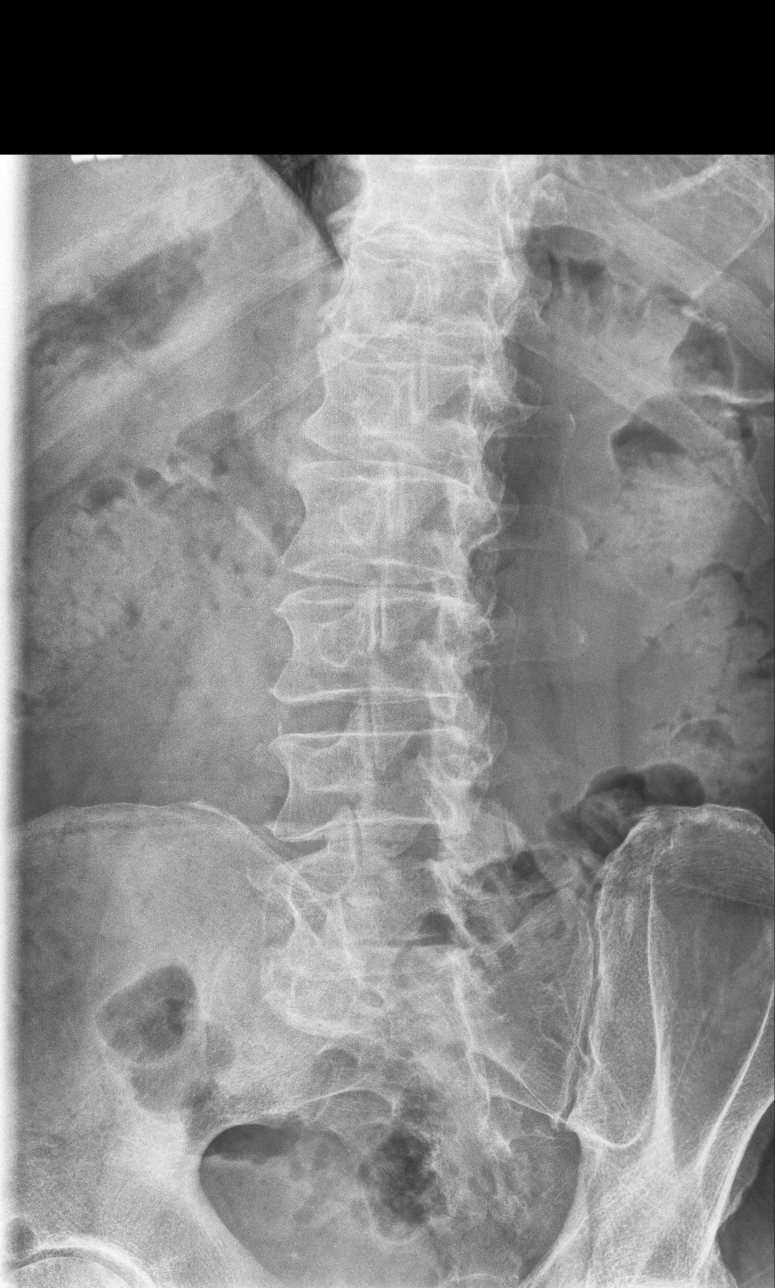

[lumbar spine oblique (2 of 2)]
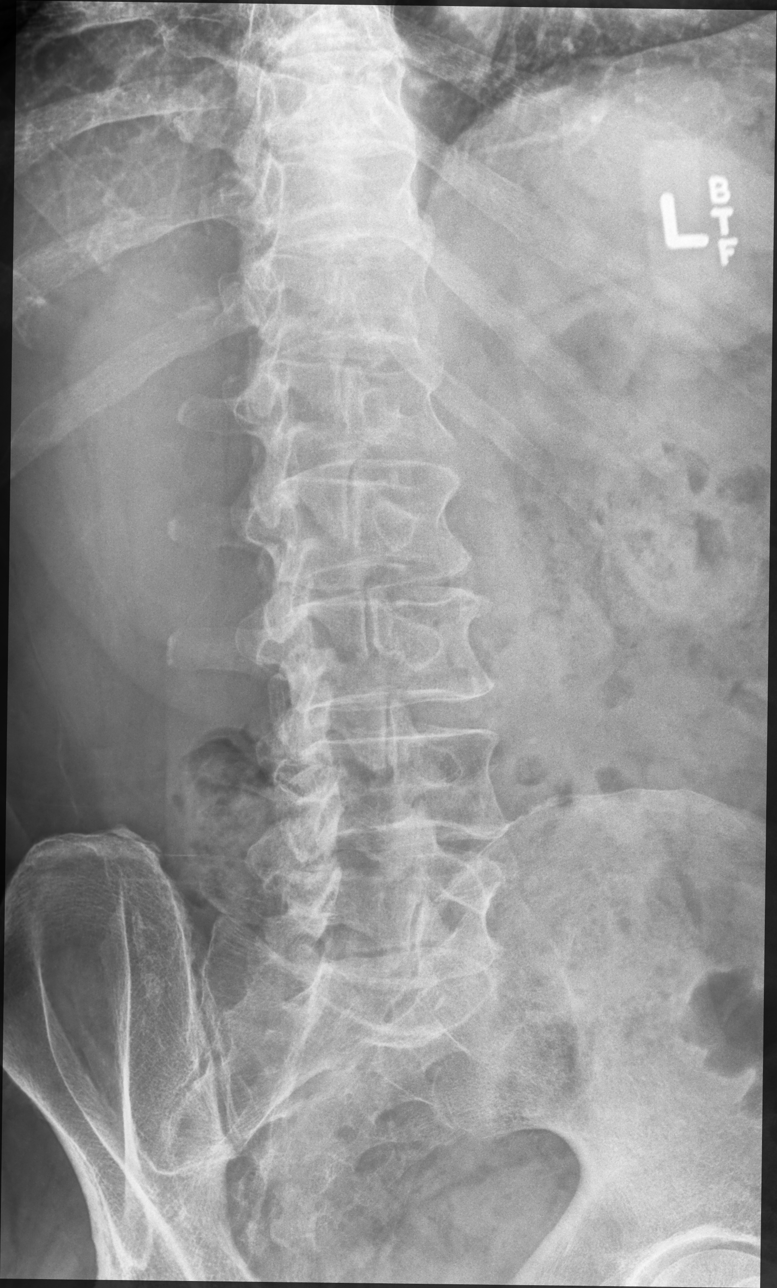

[lumbar spine lat (1 of 2)]
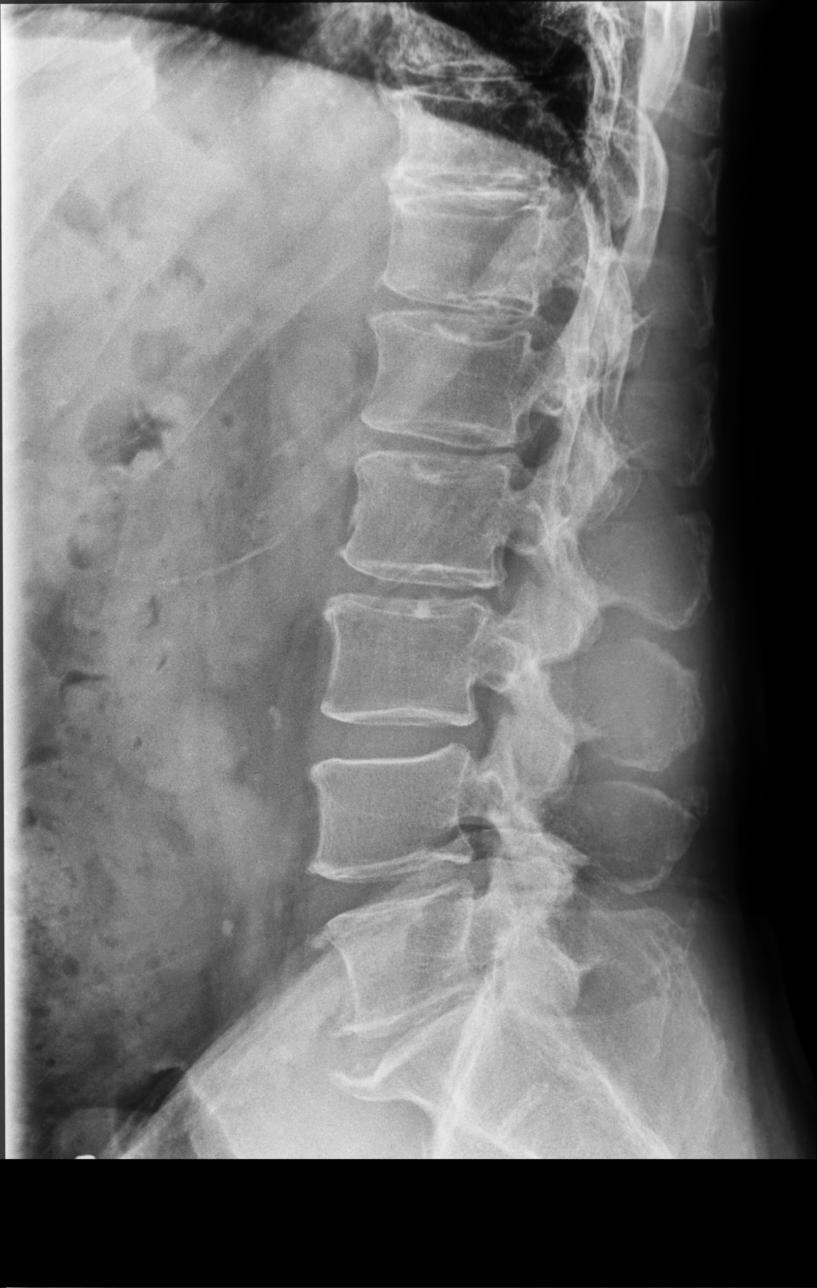

[lumbar spine lat (2 of 2)]
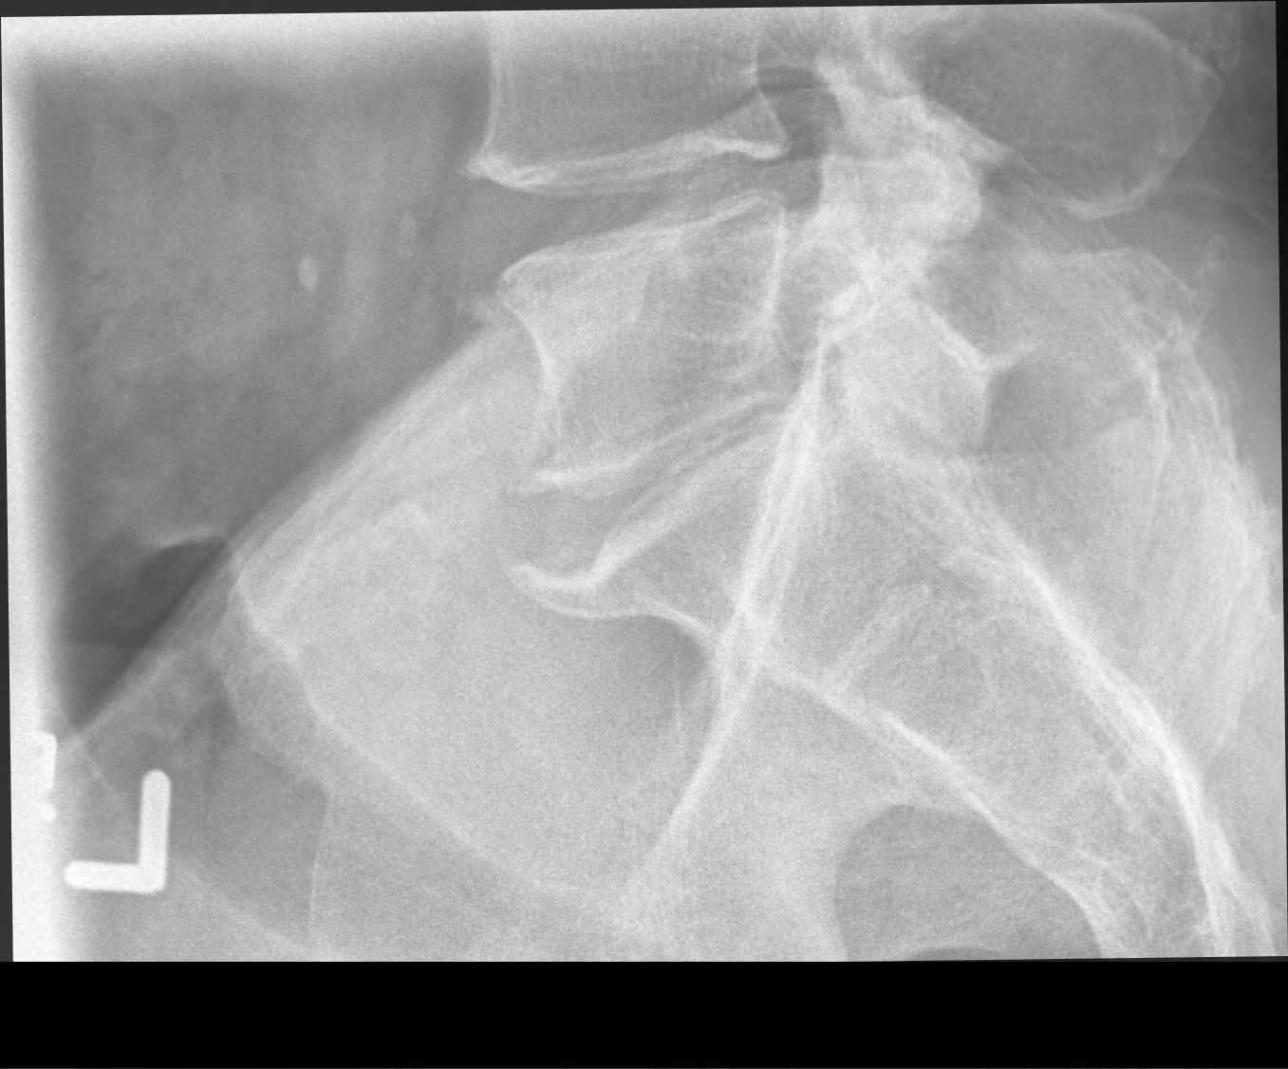

[5 of 5 positions shown; findings below may reference images not displayed]

FINDINGS: Paraspinal soft tissues are normal. Diffuse multilevel degenerative
change. No acute bony abnormality identified. No evidence of
fracture. Aortoiliac atherosclerotic vascular calcification.
IMPRESSION: 1. Diffuse multilevel degenerative change lumbar spine. No acute
bony abnormality.

2.  Aortoiliac atherosclerotic vascular disease.

## 2020-02-12 ENCOUNTER — Encounter: Payer: Self-pay | Admitting: Family Medicine

## 2020-02-12 ENCOUNTER — Telehealth: Payer: Self-pay | Admitting: *Deleted

## 2020-02-12 ENCOUNTER — Other Ambulatory Visit: Payer: Self-pay | Admitting: General Practice

## 2020-02-12 DIAGNOSIS — Z8249 Family history of ischemic heart disease and other diseases of the circulatory system: Secondary | ICD-10-CM

## 2020-02-12 DIAGNOSIS — E669 Obesity, unspecified: Secondary | ICD-10-CM

## 2020-02-12 DIAGNOSIS — E781 Pure hyperglyceridemia: Secondary | ICD-10-CM

## 2020-02-12 NOTE — Telephone Encounter (Signed)
Placed these orders again.

## 2020-02-12 NOTE — Telephone Encounter (Signed)
Can we put this order in again. Pt states that he wants to do the stress test.

## 2020-02-12 NOTE — Telephone Encounter (Signed)
Please advise 

## 2020-02-12 NOTE — Telephone Encounter (Signed)
Left message for patient to call and schedule ETT ordered by Dr. Stormy Card will also need COVID screening.

## 2020-02-12 NOTE — Telephone Encounter (Signed)
Ok to re-order stress test (same test and dx as before)

## 2020-02-28 ENCOUNTER — Telehealth (HOSPITAL_COMMUNITY): Payer: Self-pay

## 2020-02-28 NOTE — Telephone Encounter (Signed)
Encounter complete. 

## 2020-02-29 ENCOUNTER — Other Ambulatory Visit (HOSPITAL_COMMUNITY)
Admission: RE | Admit: 2020-02-29 | Discharge: 2020-02-29 | Disposition: A | Discharge status: Home / Self Care | Payer: Medicare Other | Source: Ambulatory Visit | Attending: Family Medicine | Admitting: Family Medicine

## 2020-02-29 DIAGNOSIS — Z01812 Encounter for preprocedural laboratory examination: Secondary | ICD-10-CM | POA: Diagnosis present

## 2020-02-29 DIAGNOSIS — Z20822 Contact with and (suspected) exposure to covid-19: Secondary | ICD-10-CM | POA: Diagnosis not present

## 2020-02-29 LAB — SARS CORONAVIRUS 2 (TAT 6-24 HRS): SARS Coronavirus 2: NEGATIVE

## 2020-03-04 ENCOUNTER — Other Ambulatory Visit: Payer: Self-pay

## 2020-03-04 ENCOUNTER — Ambulatory Visit (HOSPITAL_COMMUNITY)
Admission: RE | Admit: 2020-03-04 | Discharge: 2020-03-04 | Disposition: A | Discharge status: Home / Self Care | Payer: Medicare Other | Source: Ambulatory Visit | Attending: Cardiovascular Disease | Admitting: Cardiovascular Disease

## 2020-03-04 DIAGNOSIS — Z8249 Family history of ischemic heart disease and other diseases of the circulatory system: Secondary | ICD-10-CM | POA: Diagnosis not present

## 2020-03-04 DIAGNOSIS — E669 Obesity, unspecified: Secondary | ICD-10-CM | POA: Insufficient documentation

## 2020-03-04 DIAGNOSIS — E781 Pure hyperglyceridemia: Secondary | ICD-10-CM | POA: Insufficient documentation

## 2020-03-04 LAB — EXERCISE TOLERANCE TEST
Estimated workload: 10.1 METS
Exercise duration (min): 8 min
Exercise duration (sec): 0 s
MPHR: 156 {beats}/min
Peak HR: 155 {beats}/min
Percent HR: 100 %
Rest HR: 91 {beats}/min

## 2021-04-28 ENCOUNTER — Encounter: Payer: Self-pay | Admitting: *Deleted

## 2021-06-04 IMAGING — CR DG CHEST 2V
2 series · 2 of 2 positions shown · non-contrast
Comparison: None.

CLINICAL DATA: Generalized chest pain for 1 day, history of tobacco
abuse

EXAM:
CHEST - 2 VIEW

[chest pa]
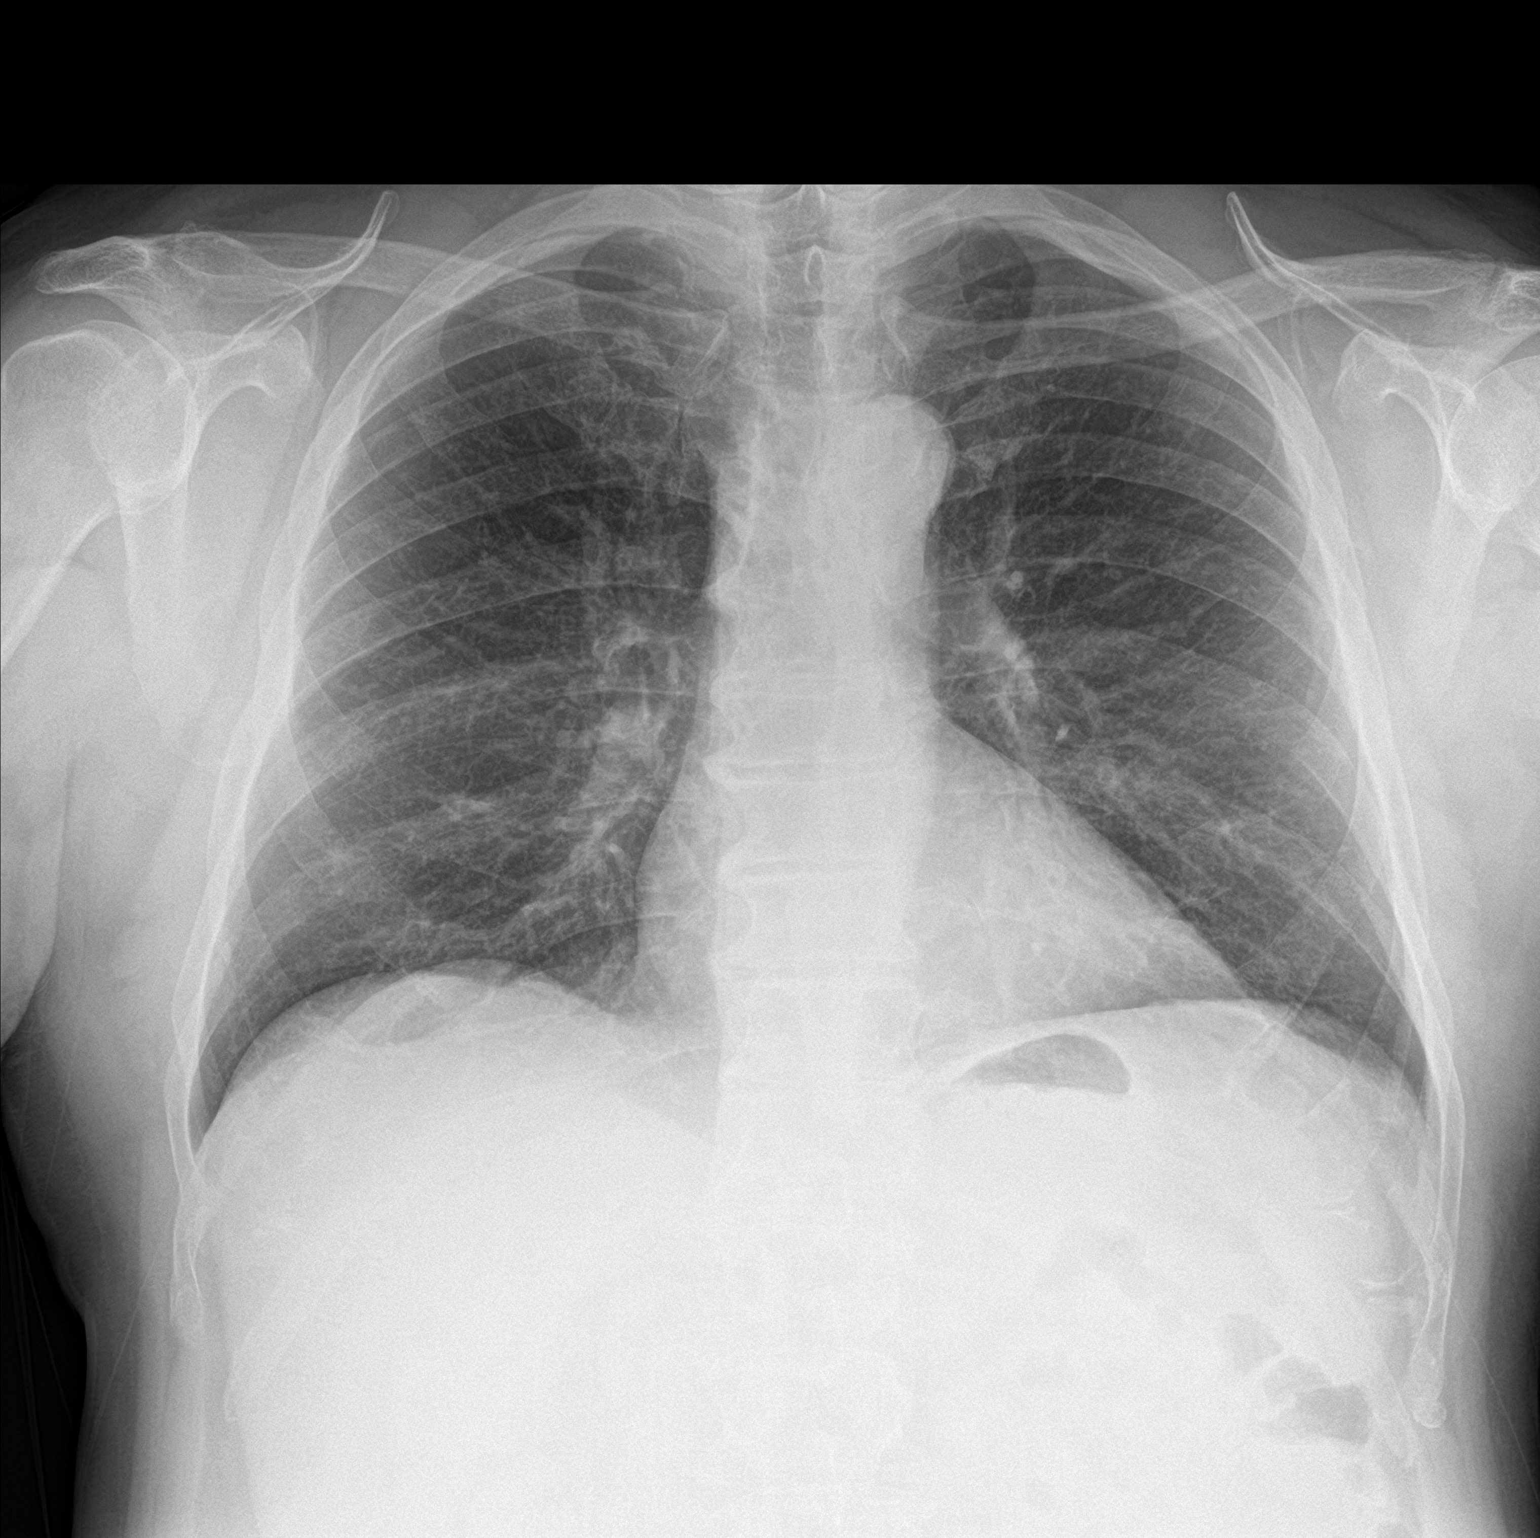

[chest lat]
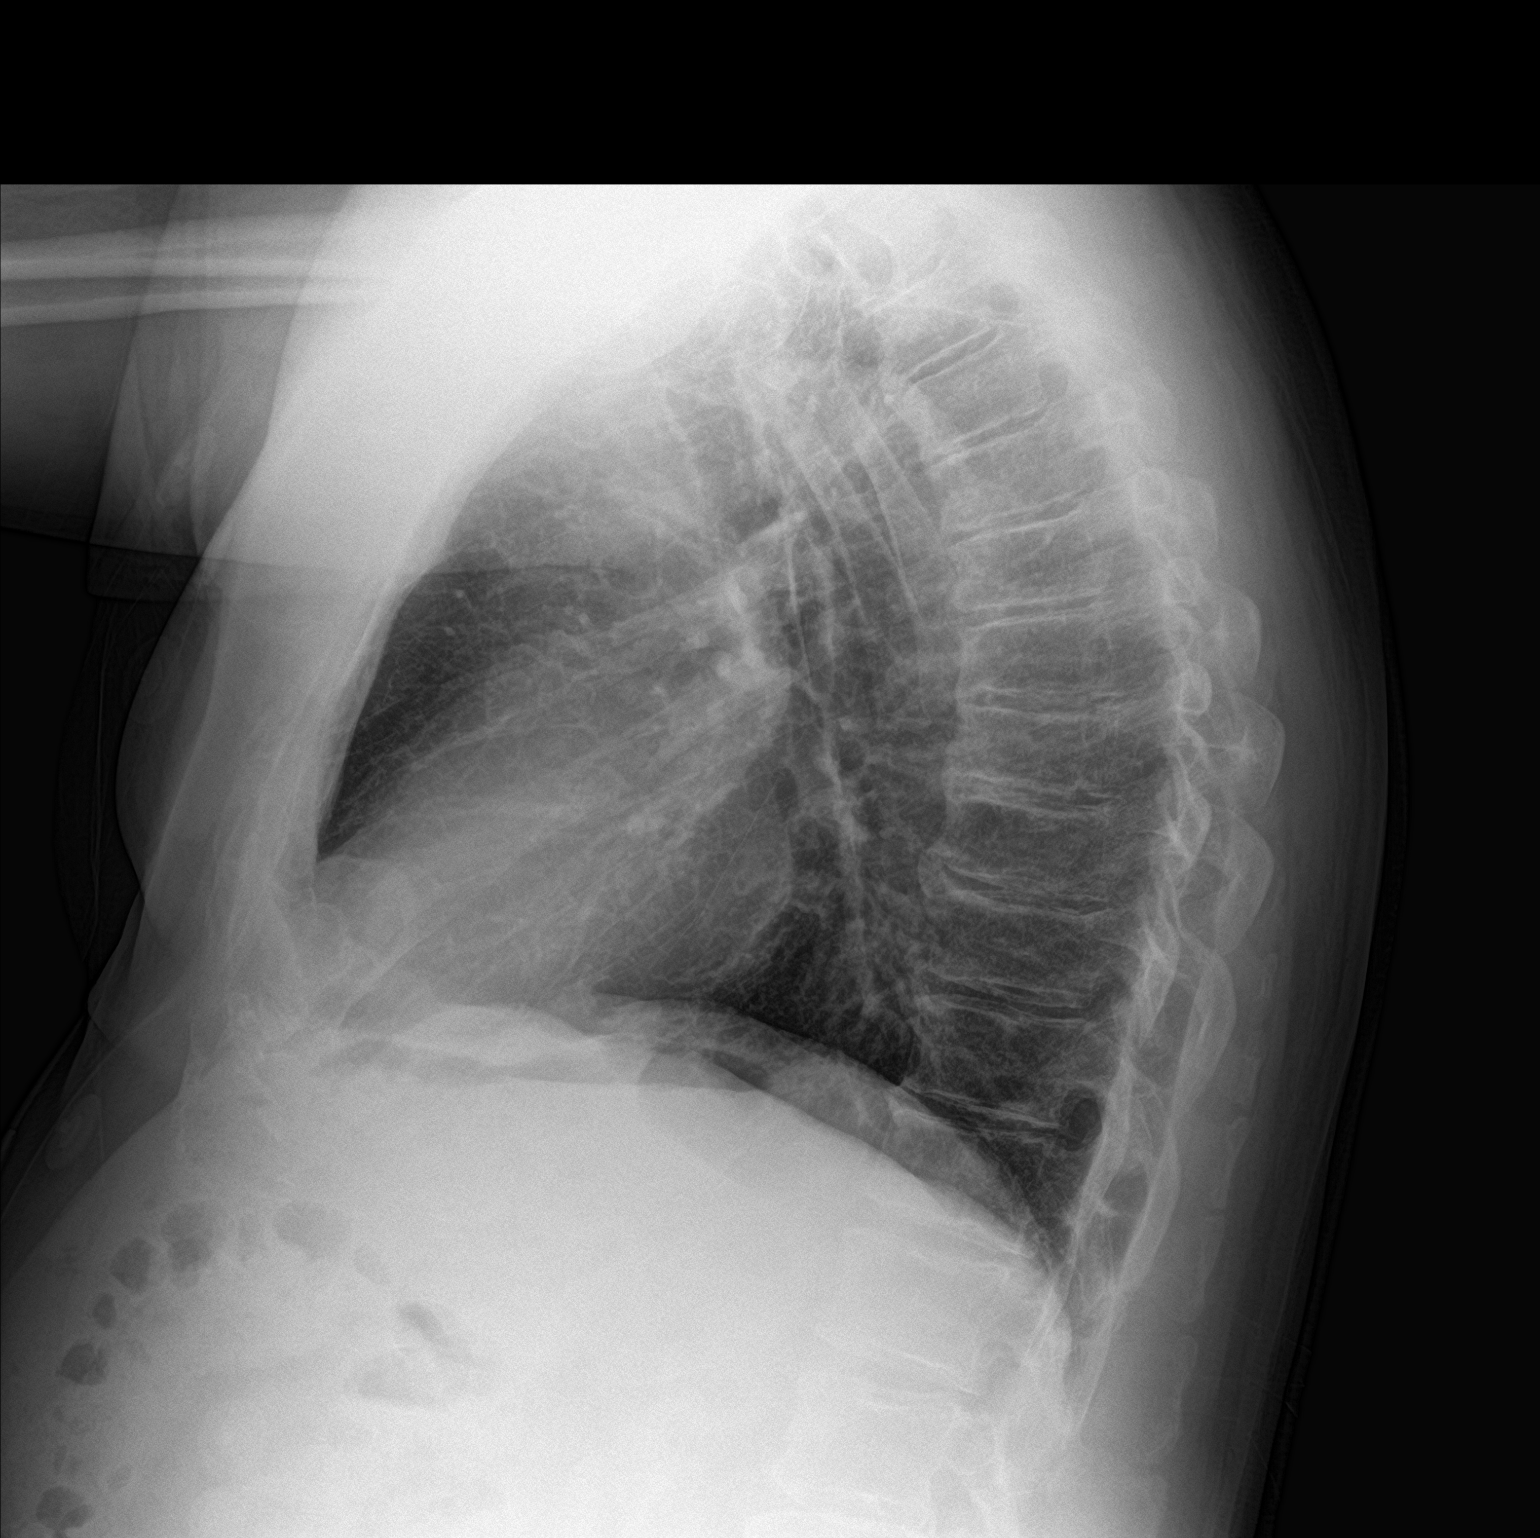

[2 of 2 positions shown; findings below may reference images not displayed]

FINDINGS: Frontal and lateral views the chest demonstrate an unremarkable
cardiac silhouette. No airspace disease, effusion, or pneumothorax.
Interstitial prominence likely related to tobacco abuse. No acute
bony abnormalities.
IMPRESSION: No active cardiopulmonary disease.

## 2022-03-29 ENCOUNTER — Encounter: Payer: Self-pay | Admitting: Family Medicine

## 2022-03-29 ENCOUNTER — Ambulatory Visit (INDEPENDENT_AMBULATORY_CARE_PROVIDER_SITE_OTHER): Payer: Medicare Other | Admitting: Family Medicine

## 2022-03-29 VITALS — BP 134/86 | HR 86 | Temp 98.0°F | Resp 16 | Ht 67.0 in | Wt 194.0 lb

## 2022-03-29 DIAGNOSIS — L989 Disorder of the skin and subcutaneous tissue, unspecified: Secondary | ICD-10-CM | POA: Diagnosis not present

## 2022-03-29 DIAGNOSIS — R972 Elevated prostate specific antigen [PSA]: Secondary | ICD-10-CM

## 2022-03-29 DIAGNOSIS — Z1159 Encounter for screening for other viral diseases: Secondary | ICD-10-CM

## 2022-03-29 DIAGNOSIS — Z125 Encounter for screening for malignant neoplasm of prostate: Secondary | ICD-10-CM

## 2022-03-29 DIAGNOSIS — H60542 Acute eczematoid otitis externa, left ear: Secondary | ICD-10-CM | POA: Diagnosis not present

## 2022-03-29 DIAGNOSIS — E669 Obesity, unspecified: Secondary | ICD-10-CM

## 2022-03-29 LAB — HEPATIC FUNCTION PANEL
ALT: 13 U/L (ref 0–53)
AST: 17 U/L (ref 0–37)
Albumin: 4.4 g/dL (ref 3.5–5.2)
Alkaline Phosphatase: 62 U/L (ref 39–117)
Bilirubin, Direct: 0.2 mg/dL (ref 0.0–0.3)
Total Bilirubin: 0.9 mg/dL (ref 0.2–1.2)
Total Protein: 7.1 g/dL (ref 6.0–8.3)

## 2022-03-29 LAB — CBC WITH DIFFERENTIAL/PLATELET
Basophils Absolute: 0 10*3/uL (ref 0.0–0.1)
Basophils Relative: 0.8 % (ref 0.0–3.0)
Eosinophils Absolute: 0.1 10*3/uL (ref 0.0–0.7)
Eosinophils Relative: 1.5 % (ref 0.0–5.0)
HCT: 42.2 % (ref 39.0–52.0)
Hemoglobin: 14.2 g/dL (ref 13.0–17.0)
Lymphocytes Relative: 33.8 % (ref 12.0–46.0)
Lymphs Abs: 2 10*3/uL (ref 0.7–4.0)
MCHC: 33.6 g/dL (ref 30.0–36.0)
MCV: 91.5 fl (ref 78.0–100.0)
Monocytes Absolute: 0.5 10*3/uL (ref 0.1–1.0)
Monocytes Relative: 8 % (ref 3.0–12.0)
Neutro Abs: 3.3 10*3/uL (ref 1.4–7.7)
Neutrophils Relative %: 55.9 % (ref 43.0–77.0)
Platelets: 218 10*3/uL (ref 150.0–400.0)
RBC: 4.61 Mil/uL (ref 4.22–5.81)
RDW: 13.2 % (ref 11.5–15.5)
WBC: 5.9 10*3/uL (ref 4.0–10.5)

## 2022-03-29 LAB — LIPID PANEL
Cholesterol: 150 mg/dL (ref 0–200)
HDL: 42.3 mg/dL (ref >39.00)
LDL Cholesterol: 71 mg/dL (ref 0–99)
NonHDL: 107.52
Total CHOL/HDL Ratio: 4
Triglycerides: 181 mg/dL — ABNORMAL HIGH (ref 0.0–149.0)
VLDL: 36.2 mg/dL (ref 0.0–40.0)

## 2022-03-29 LAB — BASIC METABOLIC PANEL
BUN: 16 mg/dL (ref 6–23)
CO2: 32 mEq/L (ref 19–32)
Calcium: 9.6 mg/dL (ref 8.4–10.5)
Chloride: 101 mEq/L (ref 96–112)
Creatinine, Ser: 1.03 mg/dL (ref 0.40–1.50)
GFR: 75.39 mL/min (ref >60.00)
Glucose, Bld: 110 mg/dL — ABNORMAL HIGH (ref 70–99)
Potassium: 4.5 mEq/L (ref 3.5–5.1)
Sodium: 138 mEq/L (ref 135–145)

## 2022-03-29 LAB — PSA, MEDICARE: PSA: 5.05 ng/ml — ABNORMAL HIGH (ref 0.10–4.00)

## 2022-03-29 MED ORDER — FLUOCINOLONE ACETONIDE 0.01 % OT OIL: 5.0000 [drp] | TOPICAL_OIL | Freq: Every day | OTIC | 0 refills | Status: DC

## 2022-03-29 NOTE — Progress Notes (Signed)
   Subjective:    Patient ID: Randy Henderson, male    DOB: 1956/09/14, 66 y.o.   MRN: [id number]  HPI Obesity- pt has lost 8 lbs since last visit.  BMI now 30.38.  Pt reports feeling good.  No CP, SOB, abd pain, N/V.  Nodule- 'it's been there a long time'.  Has recently started itching.  Behind R ear on scalp.  No drainage.  Mild TTP.  L ear- dry and itchy.  Spends a lot of time swimming.  Has been present since October.  Has used Vit E oil w/o relief.  Health Maintenance- declines PNA.  UTD on colonoscopy, Tdap.  Health Maintenance  Topic Date Due  . Hepatitis C Screening  Never done  . COVID-19 Vaccine (3 - Booster for Pfizer series) 03/02/2020  . Zoster Vaccines- Shingrix (1 of 2) 06/29/2022 (Originally 02/25/2005)  . Pneumonia Vaccine 26+ Years old (1 - PCV) 03/30/2023 (Originally 02/26/2020)  . INFLUENZA VACCINE  05/31/2022  . COLONOSCOPY (Pts 45-19yr Insurance coverage will need to be confirmed)  10/08/2025  . TETANUS/TDAP  08/16/2028  . HPV VACCINES  Aged Out      Review of Systems For ROS see HPI     Objective:   Physical Exam        Assessment & Plan:

## 2022-03-29 NOTE — Patient Instructions (Addendum)
Follow up in 1 year or as needed We'll notify you of your lab results and make any changes if needed Keep up the good work on healthy diet and regular exercise- you're doing great!!! We'll call you with your Dermatology appt USE the Fluocinolone oil as needed for ear eczema Call with any questions or concerns Stay Safe!  Stay Healthy! Have a great summer!!!

## 2022-03-30 ENCOUNTER — Other Ambulatory Visit (INDEPENDENT_AMBULATORY_CARE_PROVIDER_SITE_OTHER): Payer: Medicare Other

## 2022-03-30 ENCOUNTER — Other Ambulatory Visit: Payer: Self-pay

## 2022-03-30 DIAGNOSIS — R739 Hyperglycemia, unspecified: Secondary | ICD-10-CM | POA: Diagnosis not present

## 2022-03-30 LAB — HEMOGLOBIN A1C: Hgb A1c MFr Bld: 6.1 % (ref 4.6–6.5)

## 2022-03-30 LAB — HEPATITIS C ANTIBODY
Hepatitis C Ab: NONREACTIVE
SIGNAL TO CUT-OFF: 0.13 (ref <1.00)

## 2022-03-30 NOTE — Progress Notes (Signed)
A1C added, will wait for those results

## 2022-03-30 NOTE — Assessment & Plan Note (Signed)
Pt has lost 8 lbs since last visit.  Applauded his efforts and encouraged him to continue on healthy diet and regular exercise.  Check labs to risk stratify.  Will follow.

## 2022-04-18 ENCOUNTER — Encounter: Payer: Self-pay | Admitting: Dermatology

## 2022-04-18 ENCOUNTER — Ambulatory Visit (INDEPENDENT_AMBULATORY_CARE_PROVIDER_SITE_OTHER): Payer: Medicare Other | Admitting: Dermatology

## 2022-04-18 DIAGNOSIS — S0006XA Insect bite (nonvenomous) of scalp, initial encounter: Secondary | ICD-10-CM

## 2022-04-18 DIAGNOSIS — Z1283 Encounter for screening for malignant neoplasm of skin: Secondary | ICD-10-CM

## 2022-04-18 DIAGNOSIS — W57XXXA Bitten or stung by nonvenomous insect and other nonvenomous arthropods, initial encounter: Secondary | ICD-10-CM | POA: Diagnosis not present

## 2022-04-18 DIAGNOSIS — L821 Other seborrheic keratosis: Secondary | ICD-10-CM

## 2022-04-18 DIAGNOSIS — S0005XA Superficial foreign body of scalp, initial encounter: Secondary | ICD-10-CM | POA: Diagnosis not present

## 2022-05-08 ENCOUNTER — Encounter: Payer: Self-pay | Admitting: Dermatology

## 2022-05-14 NOTE — Progress Notes (Addendum)
   New patient visit   Subjective  Randy Henderson is a 66 y.o. male who presents for the following: Annual Exam (Few places on scalp- wants checked).  General skin examination, new spot on left scalp Location:  Duration:  Quality:  Associated Signs/Symptoms: Modifying Factors:  Severity:  Timing: Context:   Objective  Well appearing patient in no apparent distress; mood and affect are within normal limits. Waist up skin examination: No atypical pigmented lesions (all checked with dermoscopy), no nonmelanoma skin cancer.  Multiple 4 to 8 mm brown textured flattopped papules  Left Parietal Scalp Embedded 5 mm by tick    All skin waist up examined.   Assessment & Plan    Encounter for screening for malignant neoplasm of skin  Annual skin examination.  Tick bite with subsequent removal of tick Left Parietal Scalp  Forceps plus scalpel removal in toto, no mouthparts remaining.  Warning signs of tickborne disease reviewed.  Seborrheic keratosis  Leave if stable      I, Lavonna Monarch, MD, have reviewed all documentation for this visit.  The documentation on 05/14/22 for the exam, diagnosis, procedures, and orders are all accurate and complete.

## 2022-05-14 NOTE — Addendum Note (Signed)
Addended by: Lavonna Monarch on: 05/14/2022 06:08 AM   Modules accepted: Level of Service

## 2022-12-20 ENCOUNTER — Other Ambulatory Visit: Payer: Self-pay | Admitting: Urology

## 2022-12-20 DIAGNOSIS — C61 Malignant neoplasm of prostate: Secondary | ICD-10-CM

## 2023-01-11 ENCOUNTER — Telehealth: Payer: Self-pay | Admitting: Family Medicine

## 2023-01-11 NOTE — Telephone Encounter (Signed)
Called patient to schedule Medicare Annual Wellness Visit (AWV). Left message for patient to call back and schedule Medicare Annual Wellness Visit (AWV).  Last date of AWV:  AWVI eligible as of 01/29/2021  Please schedule an AWVI appointment at any time with [facility].  If any questions, please contact me at [phone].   Thank you,  [place] Direct dial  [phone]

## 2023-01-19 ENCOUNTER — Ambulatory Visit
Admission: RE | Admit: 2023-01-19 | Discharge: 2023-01-19 | Disposition: A | Discharge status: Home / Self Care | Payer: Medicare Other | Source: Ambulatory Visit | Attending: Urology | Admitting: Urology

## 2023-01-19 DIAGNOSIS — C61 Malignant neoplasm of prostate: Secondary | ICD-10-CM

## 2023-01-19 MED ORDER — GADOPICLENOL 0.5 MMOL/ML IV SOLN
9.0000 mL | Freq: Once | INTRAVENOUS | Status: AC | PRN
  Administered 2023-01-19: 9 mL via INTRAVENOUS

## 2023-02-01 ENCOUNTER — Telehealth: Payer: Self-pay | Admitting: Family Medicine

## 2023-02-01 NOTE — Telephone Encounter (Signed)
Called patient to schedule Medicare Annual Wellness Visit (AWV). Left message for patient to call back and schedule Medicare Annual Wellness Visit (AWV).  Last date of AWV: AWVI eligible as of 01/29/2021   Please schedule an AWVI appointment at any time with [place] VISIT .  If any questions, please contact me at [phone].    Thank you,  [place] Direct dial  [phone]

## 2023-02-17 ENCOUNTER — Telehealth: Payer: Self-pay | Admitting: Family Medicine

## 2023-02-17 NOTE — Telephone Encounter (Signed)
Called patient to schedule Medicare Annual Wellness Visit (AWV). Left message for patient to call back and schedule Medicare Annual Wellness Visit (AWV).  Last date of AWV: AWVI eligible as of 01/29/2021   Please schedule an AWVI appointment at any time with [facility] ANNUAL WELLNESS VISIT .  If any questions, please contact me at [phone].    Thank you,  Lillia Lengel  [facility] Direct dial  [phone]   

## 2023-03-01 ENCOUNTER — Telehealth: Payer: Self-pay | Admitting: Family Medicine

## 2023-03-01 NOTE — Telephone Encounter (Signed)
Called patient to schedule Medicare Annual Wellness Visit (AWV). Left message for patient to call back and schedule Medicare Annual Wellness Visit (AWV).  Last date of AWV:  AWVI  eligible as of 01/29/2021   Please schedule an AWVI appointment at any time with [phone].  If any questions, please contact me at [phone].    Thank you,  [facility] Support [facility] Direct dial  [phone]

## 2023-03-15 ENCOUNTER — Telehealth: Payer: Self-pay | Admitting: Family Medicine

## 2023-03-15 NOTE — Telephone Encounter (Signed)
Called patient to schedule Medicare Annual Wellness Visit (AWV). Left message for patient to call back and schedule Medicare Annual Wellness Visit (AWV).  Last date of AWV: AWVI eligible as of 01/29/2021   Please schedule an AWVI appointment at any time with [facility] ANNUAL WELLNESS VISIT .  If any questions, please contact me at [phone].    Thank you,  Aarav Burgett  [facility] Direct dial  [phone]   

## 2023-03-16 NOTE — Telephone Encounter (Signed)
Contacted Randy Henderson to schedule their annual wellness visit. Appointment made for 03/22/2023.  Thank you,  [facility] Support [facility] Direct dial  [phone]

## 2023-03-22 ENCOUNTER — Ambulatory Visit: Payer: Medicare Other | Admitting: *Deleted

## 2023-03-22 DIAGNOSIS — Z0000 Encounter for general adult medical examination without abnormal findings: Secondary | ICD-10-CM

## 2023-03-22 NOTE — Patient Instructions (Signed)
Mr. Sylte , Thank you for taking time to come for your Medicare Wellness Visit. I appreciate your ongoing commitment to your health goals. Please review the following plan we discussed and let me know if I can assist you in the future.   Screening recommendations/referrals: Colonoscopy: up to date Recommended yearly ophthalmology/optometry visit for glaucoma screening and checkup Recommended yearly dental visit for hygiene and checkup  Vaccinations: Influenza vaccine: up to date Pneumococcal vaccine: Education provided Tdap vaccine: up tod ate Shingles vaccine: Education provided    Advanced directives: Education provided  Conditions/risks identified:     Preventive Care 65 Years and Older, Male Preventive care refers to lifestyle choices and visits with your health care provider that can promote health and wellness. What does preventive care include? A yearly physical exam. This is also called an annual well check. Dental exams once or twice a year. Routine eye exams. Ask your health care provider how often you should have your eyes checked. Personal lifestyle choices, including: Daily care of your teeth and gums. Regular physical activity. Eating a healthy diet. Avoiding tobacco and drug use. Limiting alcohol use. Practicing safe sex. Taking low doses of aspirin every day. Taking vitamin and mineral supplements as recommended by your health care provider. What happens during an annual well check? The services and screenings done by your health care provider during your annual well check will depend on your age, overall health, lifestyle risk factors, and family history of disease. Counseling  Your health care provider may ask you questions about your: Alcohol use. Tobacco use. Drug use. Emotional well-being. Home and relationship well-being. Sexual activity. Eating habits. History of falls. Memory and ability to understand (cognition). Work and work  Astronomer. Screening  You may have the following tests or measurements: Height, weight, and BMI. Blood pressure. Lipid and cholesterol levels. These may be checked every 5 years, or more frequently if you are over 67 years old. Skin check. Lung cancer screening. You may have this screening every year starting at age 23 if you have a 30-pack-year history of smoking and currently smoke or have quit within the past 15 years. Fecal occult blood test (FOBT) of the stool. You may have this test every year starting at age 64. Flexible sigmoidoscopy or colonoscopy. You may have a sigmoidoscopy every 5 years or a colonoscopy every 10 years starting at age 54. Prostate cancer screening. Recommendations will vary depending on your family history and other risks. Hepatitis C blood test. Hepatitis B blood test. Sexually transmitted disease (STD) testing. Diabetes screening. This is done by checking your blood sugar (glucose) after you have not eaten for a while (fasting). You may have this done every 1-3 years. Abdominal aortic aneurysm (AAA) screening. You may need this if you are a current or former smoker. Osteoporosis. You may be screened starting at age 36 if you are at high risk. Talk with your health care provider about your test results, treatment options, and if necessary, the need for more tests. Vaccines  Your health care provider may recommend certain vaccines, such as: Influenza vaccine. This is recommended every year. Tetanus, diphtheria, and acellular pertussis (Tdap, Td) vaccine. You may need a Td booster every 10 years. Zoster vaccine. You may need this after age 56. Pneumococcal 13-valent conjugate (PCV13) vaccine. One dose is recommended after age 46. Pneumococcal polysaccharide (PPSV23) vaccine. One dose is recommended after age 64. Talk to your health care provider about which screenings and vaccines you need and how often you  need them. This information is not intended to replace  advice given to you by your health care provider. Make sure you discuss any questions you have with your health care provider. Document Released: 11/13/2015 Document Revised: 07/06/2016 Document Reviewed: 08/18/2015 Elsevier Interactive Patient Education  2017 [employer].  Fall Prevention in the Home Falls can cause injuries. They can happen to people of all ages. There are many things you can do to make your home safe and to help prevent falls. What can I do on the outside of my home? Regularly fix the edges of walkways and driveways and fix any cracks. Remove anything that might make you trip as you walk through a door, such as a raised step or threshold. Trim any bushes or trees on the path to your home. Use bright outdoor lighting. Clear any walking paths of anything that might make someone trip, such as rocks or tools. Regularly check to see if handrails are loose or broken. Make sure that both sides of any steps have handrails. Any raised decks and porches should have guardrails on the edges. Have any leaves, snow, or ice cleared regularly. Use sand or salt on walking paths during winter. Clean up any spills in your garage right away. This includes oil or grease spills. What can I do in the bathroom? Use night lights. Install grab bars by the toilet and in the tub and shower. Do not use towel bars as grab bars. Use non-skid mats or decals in the tub or shower. If you need to sit down in the shower, use a plastic, non-slip stool. Keep the floor dry. Clean up any water that spills on the floor as soon as it happens. Remove soap buildup in the tub or shower regularly. Attach bath mats securely with double-sided non-slip rug tape. Do not have throw rugs and other things on the floor that can make you trip. What can I do in the bedroom? Use night lights. Make sure that you have a light by your bed that is easy to reach. Do not use any sheets or blankets that are too big for your bed.  They should not hang down onto the floor. Have a firm chair that has side arms. You can use this for support while you get dressed. Do not have throw rugs and other things on the floor that can make you trip. What can I do in the kitchen? Clean up any spills right away. Avoid walking on wet floors. Keep items that you use a lot in easy-to-reach places. If you need to reach something above you, use a strong step stool that has a grab bar. Keep electrical cords out of the way. Do not use floor polish or wax that makes floors slippery. If you must use wax, use non-skid floor wax. Do not have throw rugs and other things on the floor that can make you trip. What can I do with my stairs? Do not leave any items on the stairs. Make sure that there are handrails on both sides of the stairs and use them. Fix handrails that are broken or loose. Make sure that handrails are as long as the stairways. Check any carpeting to make sure that it is firmly attached to the stairs. Fix any carpet that is loose or worn. Avoid having throw rugs at the top or bottom of the stairs. If you do have throw rugs, attach them to the floor with carpet tape. Make sure that you have a light switch  at the top of the stairs and the bottom of the stairs. If you do not have them, ask someone to add them for you. What else can I do to help prevent falls? Wear shoes that: Do not have high heels. Have rubber bottoms. Are comfortable and fit you well. Are closed at the toe. Do not wear sandals. If you use a stepladder: Make sure that it is fully opened. Do not climb a closed stepladder. Make sure that both sides of the stepladder are locked into place. Ask someone to hold it for you, if possible. Clearly mark and make sure that you can see: Any grab bars or handrails. First and last steps. Where the edge of each step is. Use tools that help you move around (mobility aids) if they are needed. These  include: Canes. Walkers. Scooters. Crutches. Turn on the lights when you go into a dark area. Replace any light bulbs as soon as they burn out. Set up your furniture so you have a clear path. Avoid moving your furniture around. If any of your floors are uneven, fix them. If there are any pets around you, be aware of where they are. Review your medicines with your doctor. Some medicines can make you feel dizzy. This can increase your chance of falling. Ask your doctor what other things that you can do to help prevent falls. This information is not intended to replace advice given to you by your health care provider. Make sure you discuss any questions you have with your health care provider. Document Released: 08/13/2009 Document Revised: 03/24/2016 Document Reviewed: 11/21/2014 Elsevier Interactive Patient Education  2017 [employer].

## 2023-03-22 NOTE — Progress Notes (Signed)
Subjective:   Randy Henderson is a 67 y.o. male who presents for an Initial Medicare Annual Wellness Visit.  I connected with  Randy Henderson on 03/22/23 by a telephone enabled telemedicine application and verified that I am speaking with the correct person using two identifiers.   I discussed the limitations of evaluation and management by telemedicine. The patient expressed understanding and agreed to proceed.  Patient location: home  Provider location: telephone/home  .  Review of Systems           Objective:    There were no vitals filed for this visit. There is no height or weight on file to calculate BMI.     03/22/2023    9:35 AM 12/10/2019    9:26 PM  Advanced Directives  Does Patient Have a Medical Advance Directive? No No  Would patient like information on creating a medical advance directive? No - Patient declined No - Patient declined    Current Medications (verified) Outpatient Encounter Medications as of 03/22/2023  Medication Sig   Fluocinolone Acetonide 0.01 % OIL Place 5 drops in ear(s) daily.   No facility-administered encounter medications on file as of 03/22/2023.    Allergies (verified) Patient has no known allergies.   History: Past Medical History:  Diagnosis Date   Gout    History of chicken pox    Hx of adenomatous polyp of colon 10/14/2018   Past Surgical History:  Procedure Laterality Date   COLONOSCOPY W/ POLYPECTOMY  10/08/2018   TONSILLECTOMY     Family History  Problem Relation Age of Onset   Heart failure Mother    Arthritis Mother    Heart disease Mother    Heart attack Mother    Stroke Father    Colon polyps Father    Early death Sister        Mad Cow disease   Colon polyps Sister    Heart attack Maternal Grandfather    Heart attack Paternal Grandfather    Colon cancer Neg Hx    Esophageal cancer Neg Hx    Rectal cancer Neg Hx    Stomach cancer Neg Hx    Social History   Socioeconomic History   Marital status:  Married    Spouse name: Not on file   Number of children: Not on file   Years of education: Not on file   Highest education level: Not on file  Occupational History   Not on file  Tobacco Use   Smoking status: Former    Packs/day: 0    Types: Cigarettes    Quit date: 08/16/2006    Years since quitting: 16.6   Smokeless tobacco: Never  Vaping Use   Vaping Use: Never used  Substance and Sexual Activity   Alcohol use: Yes    Comment: 7 q wk   Drug use: No   Sexual activity: Yes  Other Topics Concern   Not on file  Social History Narrative   Not on file   Social Determinants of Health   Financial Resource Strain: Low Risk  (03/22/2023)   Overall Financial Resource Strain (CARDIA)    Difficulty of Paying Living Expenses: Not hard at all  Food Insecurity: No Food Insecurity (03/22/2023)   Hunger Vital Sign    Worried About Running Out of Food in the Last Year: Never true    Ran Out of Food in the Last Year: Never true  Transportation Needs: No Transportation Needs (03/22/2023)   PRAPARE - Transportation  Lack of Transportation (Medical): No    Lack of Transportation (Non-Medical): No  Physical Activity: Sufficiently Active (03/22/2023)   Exercise Vital Sign    Days of Exercise per Week: 4 days    Minutes of Exercise per Session: 40 min  Stress: No Stress Concern Present (03/22/2023)   [employer] of Occupational Health - Occupational Stress Questionnaire    Feeling of Stress : Not at all  Social Connections: Moderately Isolated (03/22/2023)   Social Connection and Isolation Panel [NHANES]    Frequency of Communication with Friends and Family: More than three times a week    Frequency of Social Gatherings with Friends and Family: Three times a week    Attends Religious Services: Never    Active Member of Clubs or Organizations: No    Attends Banker Meetings: Never    Marital Status: Married    Tobacco Counseling Counseling given: Not  Answered   Clinical Intake:  Pre-visit preparation completed: Yes  Pain : No/denies pain     Nutritional Risks: None Diabetes: No  How often do you need to have someone help you when you read instructions, pamphlets, or other written materials from your doctor or pharmacy?: 1 - Never  Diabetic?  no  Interpreter Needed?: No  Information entered by :: Remi Haggard LPN   Activities of Daily Living    03/21/2023   10:37 AM 03/29/2022    9:23 AM  In your present state of health, do you have any difficulty performing the following activities:  Hearing? 0 0  Vision? 0 0  Difficulty concentrating or making decisions? 0 0  Walking or climbing stairs? 0 0  Dressing or bathing? 0 0  Doing errands, shopping? 0 0  Preparing Food and eating ? N   Using the Toilet? N   In the past six months, have you accidently leaked urine? N   Do you have problems with loss of bowel control? N   Managing your Medications? N   Managing your Finances? N   Housekeeping or managing your Housekeeping? N     Patient Care Team: Sheliah Hatch, MD as PCP - General (Family Medicine)  Indicate any recent Medical Services you may have received from other than [facility] providers in the past year (date may be approximate).     Assessment:   This is a routine wellness examination for Randy Henderson.  Hearing/Vision screen Hearing Screening - Comments:: No trouble hearing Vision Screening - Comments:: Up to date Unsure name  Dietary issues and exercise activities discussed: Current Exercise Habits: Home exercise routine, Type of exercise: strength training/weights;stretching;walking (swim), Time (Minutes): 10, Frequency (Times/Week): 5, Weekly Exercise (Minutes/Week): 50   Goals Addressed             This Visit's Progress    Weight (lb) < 200 lb (90.7 kg)         Depression Screen    03/22/2023    9:55 AM 04/11/2019    1:04 PM 08/16/2018    2:40 PM  PHQ 2/9 Scores  PHQ - 2 Score 0 0 0  PHQ- 9  Score 0 0 0    Fall Risk    03/21/2023   10:37 AM 03/29/2022    9:18 AM 04/11/2019    1:04 PM 08/16/2018    2:41 PM  Fall Risk   Falls in the past year? 0  0 No  Number falls in past yr: 0 0 0   Injury with Fall? 0 0 0  Follow up  Falls evaluation completed      FALL RISK PREVENTION PERTAINING TO THE HOME:  Any stairs in or around the home? Yes  If so, are there any without handrails? No  Home free of loose throw rugs in walkways, pet beds, electrical cords, etc? No  Adequate lighting in your home to reduce risk of falls? No   ASSISTIVE DEVICES UTILIZED TO PREVENT FALLS:  Life alert? No  Use of a cane, walker or w/c? No  Grab bars in the bathroom? No  Shower chair or bench in shower? No  Elevated toilet seat or a handicapped toilet? Yes   TIMED UP AND GO:  Was the test performed? No .      Cognitive Function:        03/22/2023    9:34 AM 03/29/2022    9:19 AM  6CIT Screen  What Year? 0 points 0 points  What month? 0 points 0 points  What time? 0 points 0 points  Count back from 20 0 points 0 points  Months in reverse 0 points 0 points  Repeat phrase 0 points 0 points  Total Score 0 points 0 points    Immunizations Immunization History  Administered Date(s) Administered   PFIZER(Purple Top)SARS-COV-2 Vaccination 12/07/2019, 01/06/2020   Tdap 08/16/2018    TDAP status: Up to date  Flu Vaccine status: Up to date  Pneumococcal vaccine status: Due, Education has been provided regarding the importance of this vaccine. Advised may receive this vaccine at local pharmacy or Health Dept. Aware to provide a copy of the vaccination record if obtained from local pharmacy or Health Dept. Verbalized acceptance and understanding.  Covid-19 vaccine status: Information provided on how to obtain vaccines.   Qualifies for Shingles Vaccine? Yes   Zostavax completed No   Shingrix Completed?: No.    Education has been provided regarding the importance of this vaccine.  Patient has been advised to call insurance company to determine out of pocket expense if they have not yet received this vaccine. Advised may also receive vaccine at local pharmacy or Health Dept. Verbalized acceptance and understanding.  Screening Tests Health Maintenance  Topic Date Due   Pneumonia Vaccine 73+ Years old (1 of 1 - PCV) 03/30/2023 (Originally 02/26/2020)   COVID-19 Vaccine (3 - Pfizer risk series) 04/07/2023 (Originally 02/03/2020)   Zoster Vaccines- Shingrix (1 of 2) 06/22/2023 (Originally 02/25/1975)   INFLUENZA VACCINE  06/01/2023   Medicare Annual Wellness (AWV)  03/21/2024   COLONOSCOPY (Pts 45-84yrs Insurance coverage will need to be confirmed)  10/08/2025   DTaP/Tdap/Td (2 - Td or Tdap) 08/16/2028   Hepatitis C Screening  Completed   HPV VACCINES  Aged Out    Health Maintenance  There are no preventive care reminders to display for this patient.   Colorectal cancer screening: Type of screening: Colonoscopy. Completed 2019. Repeat every 7 years  Lung Cancer Screening: (Low Dose CT Chest recommended if Age 67-80 years, 30 pack-year currently smoking OR have quit w/in 15years.) does not qualify.   Lung Cancer Screening Referral:   Additional Screening:  Hepatitis C Screening: does not qualify; Completed 2023  Vision Screening: Recommended annual ophthalmology exams for early detection of glaucoma and other disorders of the eye. Is the patient up to date with their annual eye exam?  Yes  Who is the provider or what is the name of the office in which the patient attends annual eye exams? [facility] If pt is not established with a provider,  would they like to be referred to a provider to establish care? No .   Dental Screening: Recommended annual dental exams for proper oral hygiene  Community Resource Referral / Chronic Care Management: CRR required this visit?  No   CCM required this visit?  No      Plan:     I have personally reviewed and noted  the following in the patient's chart:   Medical and social history Use of alcohol, tobacco or illicit drugs  Current medications and supplements including opioid prescriptions. Patient is not currently taking opioid prescriptions. Functional ability and status Nutritional status Physical activity Advanced directives List of other physicians Hospitalizations, surgeries, and ER visits in previous 12 months Vitals Screenings to include cognitive, depression, and falls Referrals and appointments  In addition, I have reviewed and discussed with patient certain preventive protocols, quality metrics, and best practice recommendations. A written personalized care plan for preventive services as well as general preventive health recommendations were provided to patient.     Remi Haggard, LPN   11/30/8655   Nurse Notes:

## 2023-11-08 ENCOUNTER — Encounter: Payer: Self-pay | Admitting: Family Medicine

## 2023-11-08 ENCOUNTER — Ambulatory Visit (INDEPENDENT_AMBULATORY_CARE_PROVIDER_SITE_OTHER): Payer: Medicare Other | Admitting: Family Medicine

## 2023-11-08 VITALS — BP 128/78 | HR 78 | Temp 98.7°F | Ht 67.0 in | Wt 195.3 lb

## 2023-11-08 DIAGNOSIS — M25551 Pain in right hip: Secondary | ICD-10-CM | POA: Diagnosis not present

## 2023-11-08 DIAGNOSIS — E66811 Obesity, class 1: Secondary | ICD-10-CM | POA: Diagnosis not present

## 2023-11-08 LAB — HEPATIC FUNCTION PANEL
ALT: 14 U/L (ref 0–53)
AST: 19 U/L (ref 0–37)
Albumin: 4.8 g/dL (ref 3.5–5.2)
Alkaline Phosphatase: 76 U/L (ref 39–117)
Bilirubin, Direct: 0.2 mg/dL (ref 0.0–0.3)
Total Bilirubin: 0.8 mg/dL (ref 0.2–1.2)
Total Protein: 8.1 g/dL (ref 6.0–8.3)

## 2023-11-08 LAB — SEDIMENTATION RATE: Sed Rate: 7 mm/h (ref 0–20)

## 2023-11-08 LAB — BASIC METABOLIC PANEL
BUN: 18 mg/dL (ref 6–23)
CO2: 32 meq/L (ref 19–32)
Calcium: 9.8 mg/dL (ref 8.4–10.5)
Chloride: 101 meq/L (ref 96–112)
Creatinine, Ser: 1.01 mg/dL (ref 0.40–1.50)
GFR: 76.32 mL/min (ref >60.00)
Glucose, Bld: 91 mg/dL (ref 70–99)
Potassium: 4.9 meq/L (ref 3.5–5.1)
Sodium: 139 meq/L (ref 135–145)

## 2023-11-08 LAB — LIPID PANEL
Cholesterol: 164 mg/dL (ref 0–200)
HDL: 42.6 mg/dL (ref >39.00)
LDL Cholesterol: 87 mg/dL (ref 0–99)
NonHDL: 121.37
Total CHOL/HDL Ratio: 4
Triglycerides: 170 mg/dL — ABNORMAL HIGH (ref 0.0–149.0)
VLDL: 34 mg/dL (ref 0.0–40.0)

## 2023-11-08 LAB — CBC WITH DIFFERENTIAL/PLATELET
Basophils Absolute: 0 10*3/uL (ref 0.0–0.1)
Basophils Relative: 0.6 % (ref 0.0–3.0)
Eosinophils Absolute: 0.1 10*3/uL (ref 0.0–0.7)
Eosinophils Relative: 1.3 % (ref 0.0–5.0)
HCT: 45.8 % (ref 39.0–52.0)
Hemoglobin: 15.3 g/dL (ref 13.0–17.0)
Lymphocytes Relative: 30.9 % (ref 12.0–46.0)
Lymphs Abs: 2.1 10*3/uL (ref 0.7–4.0)
MCHC: 33.4 g/dL (ref 30.0–36.0)
MCV: 91.9 fL (ref 78.0–100.0)
Monocytes Absolute: 0.7 10*3/uL (ref 0.1–1.0)
Monocytes Relative: 10.8 % (ref 3.0–12.0)
Neutro Abs: 3.9 10*3/uL (ref 1.4–7.7)
Neutrophils Relative %: 56.4 % (ref 43.0–77.0)
Platelets: 217 10*3/uL (ref 150.0–400.0)
RBC: 4.99 Mil/uL (ref 4.22–5.81)
RDW: 13.1 % (ref 11.5–15.5)
WBC: 6.9 10*3/uL (ref 4.0–10.5)

## 2023-11-08 MED ORDER — MELOXICAM 15 MG PO TABS: 15.0000 mg | ORAL_TABLET | Freq: Every day | ORAL | 1 refills | Status: DC

## 2023-11-08 NOTE — Patient Instructions (Signed)
 Follow up as needed or as scheduled We'll notify you of your lab results and make any changes if needed START the Meloxicam  as needed- take w/ food Once we know what the labs are, we'll decide the next steps- Ortho or Rheum Call with any questions or concerns Stay Safe!  Stay Healthy! Happy New Year!!!

## 2023-11-08 NOTE — Progress Notes (Signed)
   Subjective:    Patient ID: Randy Henderson, male    DOB: 1956/09/12, 68 y.o.   MRN: [id number]  HPI Joint pain- pt reports pain of R lateral hip that radiates down R leg when active. Sxs started ~1 yr ago. Pain is described as a 'severe stabbing'.  Hip will not be TTP- pain is deep.  Pain w/ mowing the lawn, walking, twisting motion.  Some relief w/ Advil.  Will rarely have L thigh pain.  No issues w/ knees, shoulders, ankles, elbows.    Obesity- pt is down 5 lbs since late October.  BMI 30.59  Denies CP, SOB, abd pain, N/V.   Review of Systems For ROS see HPI     Objective:   Physical Exam Vitals reviewed.  Constitutional:      General: He is not in acute distress.    Appearance: Normal appearance. He is well-developed. He is not ill-appearing.  HENT:     Head: Normocephalic and atraumatic.  Eyes:     Extraocular Movements: Extraocular movements intact.     Conjunctiva/sclera: Conjunctivae normal.     Pupils: Pupils are equal, round, and reactive to light.  Neck:     Thyroid : No thyromegaly.  Cardiovascular:     Rate and Rhythm: Normal rate and regular rhythm.     Pulses: Normal pulses.     Heart sounds: Normal heart sounds. No murmur heard. Pulmonary:     Effort: Pulmonary effort is normal. No respiratory distress.     Breath sounds: Normal breath sounds.  Abdominal:     General: Bowel sounds are normal. There is no distension.     Palpations: Abdomen is soft.  Musculoskeletal:        General: No tenderness (no TTP over R trochanter).     Cervical back: Normal range of motion and neck supple.     Right lower leg: No edema.     Left lower leg: No edema.  Lymphadenopathy:     Cervical: No cervical adenopathy.  Skin:    General: Skin is warm and dry.  Neurological:     General: No focal deficit present.     Mental Status: He is alert and oriented to person, place, and time.     Cranial Nerves: No cranial nerve deficit.  Psychiatric:        Mood and Affect: Mood  normal.        Behavior: Behavior normal.           Assessment & Plan:   R hip pain- new.  Pt reports sxs started ~1 yr ago.  When it occurs, it is described as a severe stabbing pain that starts in R lateral hip and radiated down R leg.  Occurs when active but not a consistent pattern.  Advil improves sxs.  Pt is not interested in a daily NSAID- only wants to take PRN.  Will get labs to assess for rheumatologic process.  If rheumatologic labs are negative, will refer to Ortho.  Pt expressed understanding and is in agreement w/ plan.

## 2023-11-08 NOTE — Assessment & Plan Note (Signed)
 Ongoing issue for pt.  Limited in his ability to exercise due to ongoing hip pain.  Check labs to risk stratify.  Will follow.

## 2023-11-09 ENCOUNTER — Telehealth: Payer: Self-pay

## 2023-11-09 NOTE — Telephone Encounter (Signed)
 Pt has reviewed via MyChart

## 2023-11-09 NOTE — Telephone Encounter (Signed)
-----   Message from Neena Rhymes sent at 11/09/2023  7:32 AM EST ----- Labs look great!  Still waiting on the ANA to determine if there is an auto immune process, but right now, all looks good

## 2023-11-11 LAB — ANA: Anti Nuclear Antibody (ANA): NEGATIVE

## 2023-11-13 ENCOUNTER — Telehealth: Payer: Self-pay

## 2023-11-13 NOTE — Telephone Encounter (Signed)
 Pt has reviewed labs via MyChart

## 2023-11-13 NOTE — Telephone Encounter (Signed)
-----   Message from Neena Rhymes sent at 11/13/2023  7:47 AM EST ----- Your ANA is negative- great news!

## 2023-11-28 ENCOUNTER — Encounter: Payer: Self-pay | Admitting: Family Medicine

## 2023-11-28 NOTE — Telephone Encounter (Signed)
Patient wants to know with the negative result what the next steps are considering hip pain

## 2023-12-12 ENCOUNTER — Other Ambulatory Visit: Payer: Self-pay | Admitting: Nurse Practitioner

## 2023-12-12 DIAGNOSIS — C61 Malignant neoplasm of prostate: Secondary | ICD-10-CM

## 2023-12-20 ENCOUNTER — Other Ambulatory Visit (INDEPENDENT_AMBULATORY_CARE_PROVIDER_SITE_OTHER): Payer: Self-pay

## 2023-12-20 ENCOUNTER — Other Ambulatory Visit (INDEPENDENT_AMBULATORY_CARE_PROVIDER_SITE_OTHER): Payer: Medicare Other

## 2023-12-20 ENCOUNTER — Ambulatory Visit (INDEPENDENT_AMBULATORY_CARE_PROVIDER_SITE_OTHER): Payer: Medicare Other | Admitting: Orthopaedic Surgery

## 2023-12-20 ENCOUNTER — Other Ambulatory Visit: Payer: Self-pay

## 2023-12-20 DIAGNOSIS — M1611 Unilateral primary osteoarthritis, right hip: Secondary | ICD-10-CM

## 2023-12-20 DIAGNOSIS — M5441 Lumbago with sciatica, right side: Secondary | ICD-10-CM | POA: Diagnosis not present

## 2023-12-20 DIAGNOSIS — M79604 Pain in right leg: Secondary | ICD-10-CM

## 2023-12-20 DIAGNOSIS — M25551 Pain in right hip: Secondary | ICD-10-CM

## 2023-12-20 DIAGNOSIS — M25561 Pain in right knee: Secondary | ICD-10-CM | POA: Diagnosis not present

## 2023-12-20 DIAGNOSIS — G8929 Other chronic pain: Secondary | ICD-10-CM

## 2023-12-20 MED ORDER — LIDOCAINE HCL 1 % IJ SOLN
3.0000 mL | INTRAMUSCULAR | Status: AC | PRN
  Administered 2023-12-20: 3 mL

## 2023-12-20 MED ORDER — METHYLPREDNISOLONE ACETATE 40 MG/ML IJ SUSP
40.0000 mg | INTRAMUSCULAR | Status: AC | PRN
  Administered 2023-12-20: 40 mg via INTRA_ARTICULAR

## 2023-12-20 NOTE — Progress Notes (Signed)
The patient is someone I am seeing for the first time.  He is a 68 year old gentleman who comes in with right hip and leg pain for about a year now.  He has recently had right knee swelling as well.  He was a former patient of Dr. Prince Rome and did see Dr. Alvester Morin back in 2020 for a right sided L5-S1 epidural steroid injection which she said helped significantly.  He really denies any significant groin pain on the right side and points to more of a sciatic region of the source of his pain and it does radiate down to his knee and sometimes to his foot.  He is not a diabetic.  Put both hips through internal/external rotation he has a little bit of stiffness and just slight pain with rotation of his hips.  This is the same on both sides but is not severe.  He has a positive straight leg raise on the right side.  He has a large right knee joint effusion.  There is no pain to palpation of the right hip trochanteric area.  X-rays of his pelvis and right hip shows severe end-stage arthritis of the right hip with superior lateral joint space which is lost and bone-on-bone.  There are cystic changes as well as large osteophytes around the right hip.  The left hip has superior lateral joint space narrowing and some slight superior lateral flattening of the femoral head.  2 views of the lumbar spine showed multiple degenerative changes throughout the lumbar spine  He did let me know that he has a history of gout and sometimes will get a flareup of gout but is usually about every 11 months.  That may have what is going on with his right knee.  I was able to aspirate some fluid from his right knee and it looked to be potentially consistent with gout.  I did place a steroid injection in his right knee and this gave him good relief.  We talked in length in detail about his issues.  We are going to take a stepwise approach and send him first to Dr. Alvester Morin for a right sided epidural steroid injection at L5/S1 versus L4-L5 to the  right side.  I will then see him back in 4 weeks to see what relief he has gotten from the right knee injection as well as his lumbar spine injection.  We can then look into possibly getting him a steroid injection in his right hip joint.  He agrees with this treatment plan.     Procedure Note  Patient: Randy Henderson             Date of Birth: [birth year]           MRN: [id number]             Visit Date: 12/20/2023  Procedures: Visit Diagnoses:  1. Pain in right leg   2. Chronic right-sided low back pain with right-sided sciatica   3. Acute pain of right knee   4. Pain of right hip   5. Unilateral primary osteoarthritis, right hip     Large Joint Inj: R knee on 12/20/2023 10:10 AM Indications: diagnostic evaluation and pain Details: 22 G 1.5 in needle, superolateral approach  Arthrogram: No  Medications: 3 mL lidocaine 1 %; 40 mg methylPREDNISolone acetate 40 MG/ML Outcome: tolerated well, no immediate complications Procedure, treatment alternatives, risks and benefits explained, specific risks discussed. Consent was given by the patient. Immediately prior to procedure a time  out was called to verify the correct patient, procedure, equipment, support staff and site/side marked as required. Patient was prepped and draped in the usual sterile fashion.

## 2024-01-02 ENCOUNTER — Ambulatory Visit (INDEPENDENT_AMBULATORY_CARE_PROVIDER_SITE_OTHER): Payer: No Typology Code available for payment source | Admitting: Physical Medicine and Rehabilitation

## 2024-01-02 ENCOUNTER — Other Ambulatory Visit: Payer: Self-pay

## 2024-01-02 VITALS — BP 155/83 | HR 82

## 2024-01-02 DIAGNOSIS — M5416 Radiculopathy, lumbar region: Secondary | ICD-10-CM

## 2024-01-02 MED ORDER — METHYLPREDNISOLONE ACETATE 40 MG/ML IJ SUSP
40.0000 mg | Freq: Once | INTRAMUSCULAR | Status: AC
  Administered 2024-01-02: 40 mg

## 2024-01-02 NOTE — Progress Notes (Signed)
 Pain Scale---3 No Allergies to Contrast Dye No Blood thinners

## 2024-01-02 NOTE — Procedures (Signed)
 Lumbosacral Transforaminal Epidural Steroid Injection - Sub-Pedicular Approach with Fluoroscopic Guidance  Patient: Randy Henderson      Date of Birth: May 25, 1956 MRN: [id number] PCP: Sheliah Hatch, MD      Visit Date: 01/02/2024   Universal Protocol:    Date/Time: 01/02/2024  Consent Given By: the patient  Position: PRONE  Additional Comments: Vital signs were monitored before and after the procedure. Patient was prepped and draped in the usual sterile fashion. The correct patient, procedure, and site was verified.   Injection Procedure Details:   Procedure diagnoses: Lumbar radiculopathy [M54.16]    Meds Administered:  Meds ordered this encounter  Medications   methylPREDNISolone acetate (DEPO-MEDROL) injection 40 mg    Laterality: Right  Location/Site: L4  Needle:5.0 in., 22 ga.  Short bevel or Quincke spinal needle  Needle Placement: Transforaminal  Findings:    -Comments: Excellent flow of contrast along the nerve, nerve root and into the epidural space.  Procedure Details: After squaring off the end-plates to get a true AP view, the C-arm was positioned so that an oblique view of the foramen as noted above was visualized. The target area is just inferior to the "nose of the scotty dog" or sub pedicular. The soft tissues overlying this structure were infiltrated with 2-3 ml. of 1% Lidocaine without Epinephrine.  The spinal needle was inserted toward the target using a "trajectory" view along the fluoroscope beam.  Under AP and lateral visualization, the needle was advanced so it did not puncture dura and was located close the 6 O'Clock position of the pedical in AP tracterory. Biplanar projections were used to confirm position. Aspiration was confirmed to be negative for CSF and/or blood. A 1-2 ml. volume of Isovue-250 was injected and flow of contrast was noted at each level. Radiographs were obtained for documentation purposes.   After attaining the desired  flow of contrast documented above, a 0.5 to 1.0 ml test dose of 0.25% Marcaine was injected into each respective transforaminal space.  The patient was observed for 90 seconds post injection.  After no sensory deficits were reported, and normal lower extremity motor function was noted,   the above injectate was administered so that equal amounts of the injectate were placed at each foramen (level) into the transforaminal epidural space.   Additional Comments:  No complications occurred Dressing: 2 x 2 sterile gauze and Band-Aid    Post-procedure details: Patient was observed during the procedure. Post-procedure instructions were reviewed.  Patient left the clinic in stable condition.

## 2024-01-02 NOTE — Progress Notes (Signed)
 Randy Henderson - 68 y.o. male MRN [id number]  Date of birth: 15-Aug-1956  Office Visit Note: Visit Date: 01/02/2024 PCP: Sheliah Hatch, MD Referred by: Sheliah Hatch, MD  Subjective: Chief Complaint  Patient presents with   Lower Back - Pain   HPI:  Randy Henderson is a 68 y.o. male who comes in today at the request of Dr. Doneen Poisson for planned Right L4-5 Lumbar Transforaminal epidural steroid injection with fluoroscopic guidance.  The patient has failed conservative care including home exercise, medications, time and activity modification.  This injection will be diagnostic and hopefully therapeutic.  Please see requesting physician notes for further details and justification. Posterior right buttock pain. 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 If minimal relief consider Right S1 transforaminal epidural injection vs Right hip intra-articular injection.   ROS Otherwise per HPI.  Assessment & Plan: Visit Diagnoses:    ICD-10-CM   1. Lumbar radiculopathy  M54.16 XR C-ARM NO REPORT    Epidural Steroid injection    methylPREDNISolone acetate (DEPO-MEDROL) injection 40 mg      Plan: No additional findings.   Meds & Orders:  Meds ordered this encounter  Medications   methylPREDNISolone acetate (DEPO-MEDROL) injection 40 mg    Orders Placed This Encounter  Procedures   XR C-ARM NO REPORT   Epidural Steroid injection    Follow-up: Return for Doneen Poisson, MD as scheduled.   Procedures: No procedures performed  Lumbosacral Transforaminal Epidural Steroid Injection - Sub-Pedicular Approach with Fluoroscopic Guidance  Patient: Randy Henderson      Date of Birth: 1955-12-12 MRN: [id number] PCP: Sheliah Hatch, MD      Visit Date: 01/02/2024   Universal Protocol:    Date/Time: 01/02/2024  Consent Given By: the patient  Position: PRONE  Additional Comments: Vital signs were  monitored before and after the procedure. Patient was prepped and draped in the usual sterile fashion. The correct patient, procedure, and site was verified.   Injection Procedure Details:   Procedure diagnoses: Lumbar radiculopathy [M54.16]    Meds Administered:  Meds ordered this encounter  Medications   methylPREDNISolone acetate (DEPO-MEDROL) injection 40 mg    Laterality: Right  Location/Site: L4  Needle:5.0 in., 22 ga.  Short bevel or Quincke spinal needle  Needle Placement: Transforaminal  Findings:    -Comments: Excellent flow of contrast along the nerve, nerve root and into the epidural space.  Procedure Details: After squaring off the end-plates to get a true AP view, the C-arm was positioned so that an oblique view of the foramen as noted above was visualized. The target area is just inferior to the "nose of the scotty dog" or sub pedicular. The soft tissues overlying this structure were infiltrated with 2-3 ml. of 1% Lidocaine without Epinephrine.  The spinal needle was inserted toward the target using a "trajectory" view along the fluoroscope beam.  Under AP and lateral visualization, the needle was advanced so it did not puncture dura and was located close the 6 O'Clock position of the pedical in AP tracterory. Biplanar projections were used to confirm position. Aspiration was confirmed to be negative for CSF and/or blood. A 1-2 ml. volume of Isovue-250 was injected and flow of contrast was noted at each level. Radiographs were obtained for documentation purposes.   After attaining the desired flow of contrast documented above, a 0.5 to 1.0 ml test dose of 0.25% Marcaine was injected into each respective transforaminal  space.  The patient was observed for 90 seconds post injection.  After no sensory deficits were reported, and normal lower extremity motor function was noted,   the above injectate was administered so that equal amounts of the injectate were placed at each  foramen (level) into the transforaminal epidural space.   Additional Comments:  No complications occurred Dressing: 2 x 2 sterile gauze and Band-Aid    Post-procedure details: Patient was observed during the procedure. Post-procedure instructions were reviewed.  Patient left the clinic in stable condition.     Clinical History: MRI LUMBAR SPINE WITHOUT CONTRAST   TECHNIQUE:  Multiplanar, multisequence MR imaging of the lumbar spine was  performed. No intravenous contrast was administered.   COMPARISON: Lumbar radiographs 08/16/2018   FINDINGS:  Segmentation: Normal   Alignment: Slight retrolisthesis L2-3. Remaining alignment normal.   Vertebrae: Negative for fracture or mass. Large hemangioma L4  vertebral body.   Conus medullaris and cauda equina: Conus extends to the L1-2 level.  Conus and cauda equina appear normal.   Paraspinal and other soft tissues: Negative for paraspinous mass or  adenopathy. No soft tissue edema. Aorta normal in caliber.   Disc levels:   Congenitally small spinal canal with superimposed degenerative  change at multiple levels.   L1-2: Mild disc bulging and mild facet degeneration. No significant  stenosis   L2-3: Diffuse disc bulging and mild facet degeneration. Mild spinal  stenosis. Small left-sided disc protrusion.   L3-4: Mild disc bulging and mild facet degeneration. Mild spinal  stenosis   L4-5: Severe spinal stenosis. Central disc protrusion as well as  diffuse endplate spurring and bilateral facet degeneration  contribute to stenosis. Severe subarticular stenosis bilaterally.   L5-S1: Moderate subarticular stenosis on the right due to spurring.  Impingement right S1 nerve root.   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     Electronically Signed  By: Marlan Palau M.D.  On: 06/09/2019 14:19     Objective:  VS:  HT:    WT:   BMI:     BP:(!) 155/83  HR:82bpm  TEMP: ( )  RESP:  Physical Exam Vitals and nursing note reviewed.  Constitutional:      General: He is not in acute distress.    Appearance: Normal appearance. He is not ill-appearing.  HENT:     Head: Normocephalic and atraumatic.     Right Ear: External ear normal.     Left Ear: External ear normal.     Nose: No congestion.  Eyes:     Extraocular Movements: Extraocular movements intact.  Cardiovascular:     Rate and Rhythm: Normal rate.     Pulses: Normal pulses.  Pulmonary:     Effort: Pulmonary effort is normal. No respiratory distress.  Abdominal:     General: There is no distension.     Palpations: Abdomen is soft.  Musculoskeletal:        General: No tenderness or signs of injury.     Cervical back: Neck supple.     Right lower leg: No edema.     Left lower leg: No edema.     Comments: Patient has good distal strength without clonus. Slow to arise from setting.  Skin:    Findings: No erythema or rash.  Neurological:  General: No focal deficit present.     Mental Status: He is alert and oriented to person, place, and time.     Sensory: No sensory deficit.     Motor: No weakness or abnormal muscle tone.     Coordination: Coordination normal.  Psychiatric:        Mood and Affect: Mood normal.        Behavior: Behavior normal.      Imaging: No results found.

## 2024-01-02 NOTE — Patient Instructions (Signed)

## 2024-01-07 ENCOUNTER — Other Ambulatory Visit: Payer: Self-pay | Admitting: Family Medicine

## 2024-01-24 ENCOUNTER — Ambulatory Visit: Payer: PRIVATE HEALTH INSURANCE | Admitting: Orthopaedic Surgery

## 2024-01-29 ENCOUNTER — Encounter: Payer: Self-pay | Admitting: Orthopaedic Surgery

## 2024-01-29 ENCOUNTER — Ambulatory Visit (INDEPENDENT_AMBULATORY_CARE_PROVIDER_SITE_OTHER): Payer: PRIVATE HEALTH INSURANCE | Admitting: Orthopaedic Surgery

## 2024-01-29 DIAGNOSIS — M5441 Lumbago with sciatica, right side: Secondary | ICD-10-CM

## 2024-01-29 DIAGNOSIS — M5416 Radiculopathy, lumbar region: Secondary | ICD-10-CM | POA: Diagnosis not present

## 2024-01-29 DIAGNOSIS — M1611 Unilateral primary osteoarthritis, right hip: Secondary | ICD-10-CM | POA: Diagnosis not present

## 2024-01-29 DIAGNOSIS — G8929 Other chronic pain: Secondary | ICD-10-CM | POA: Diagnosis not present

## 2024-01-29 NOTE — Progress Notes (Signed)
 The patient is an active 68 year old gentleman who comes overall up after Dr. Alvester Morin performed a ESI in early March of this month and the patient's lumbar spine to the right side.  This was at the L4 level.  Prior to that visit I did see the patient in February and aspirated his right knee which was consistent with gout and placed a steroid injection in his right knee.  He also has well-documented arthritis in his right hip.  He says everything is stable now and the injection helped for a few days but right now he is at a good level of function.  He has a very high pain tolerance but will get more active as he gets outside more with warmer weather.  Right now though he says he is stable.  We did talk about his right hip.  He does have excellent range of motion of the right hip and just some mild pain in the groin.  The next step for his hip would be considering an intra-articular injection in that hip joint.  His right knee is stable and his back is stable as well.  His left hip also moves smoothly.  At this point follow-up can be as needed.  If he decides to have a right hip intra-articular steroid injection he will contact the office for an appointment with Dr. Alvester Morin to provide this injection under direct fluoroscopy since he does have a relationship with Dr. Alvester Morin for injections.  He agrees to this treatment plan.

## 2024-01-30 ENCOUNTER — Ambulatory Visit
Admission: RE | Admit: 2024-01-30 | Discharge: 2024-01-30 | Disposition: A | Discharge status: Home / Self Care | Payer: PRIVATE HEALTH INSURANCE | Source: Ambulatory Visit | Attending: Nurse Practitioner

## 2024-01-30 DIAGNOSIS — C61 Malignant neoplasm of prostate: Secondary | ICD-10-CM

## 2024-01-30 MED ORDER — GADOPICLENOL 0.5 MMOL/ML IV SOLN
10.0000 mL | Freq: Once | INTRAVENOUS | Status: AC | PRN
Start: 2024-01-30 — End: 2024-01-30
  Administered 2024-01-30: 10 mL via INTRAVENOUS

## 2024-06-14 ENCOUNTER — Encounter: Payer: Self-pay | Admitting: Family Medicine

## 2024-06-14 ENCOUNTER — Ambulatory Visit (INDEPENDENT_AMBULATORY_CARE_PROVIDER_SITE_OTHER): Admitting: Family Medicine

## 2024-06-14 VITALS — BP 160/82 | HR 74 | Temp 97.5°F | Resp 18 | Ht 67.0 in | Wt 198.0 lb

## 2024-06-14 DIAGNOSIS — R030 Elevated blood-pressure reading, without diagnosis of hypertension: Secondary | ICD-10-CM | POA: Diagnosis not present

## 2024-06-14 DIAGNOSIS — M10071 Idiopathic gout, right ankle and foot: Secondary | ICD-10-CM

## 2024-06-14 MED ORDER — INDOMETHACIN 50 MG PO CAPS: 50.0000 mg | ORAL_CAPSULE | Freq: Three times a day (TID) | ORAL | 0 refills | Status: AC

## 2024-06-14 MED ORDER — PREDNISONE 20 MG PO TABS: 40.0000 mg | ORAL_TABLET | Freq: Every day | ORAL | 0 refills | Status: AC

## 2024-06-14 NOTE — Progress Notes (Signed)
 Subjective:     Patient ID: Randy Henderson, male    DOB: 1956-07-07, 68 y.o.   MRN: [id number]  Chief Complaint  Patient presents with   Gout    g   Hypertension    Gout flare-up started a few days ago Taking Advil with no relief    HPI Discussed the use of AI scribe software for clinical note transcription with the patient, who gave verbal consent to proceed.  History of Present Illness Randy Henderson is a 68 year old male with gout who presents with a gout flare in the right ankle.  He has a history of gout, typically affecting one of his ankles, with the current flare occurring in the right ankle. The flare began three days ago, accompanied by swelling, redness, and tenderness. He has been taking Advil without improvement. He typically manages his gout flares with prednisone  and indomethacin , which he usually obtains from a [facility] that has since closed. He describes the prednisone  regimen as a 'four, three, two, one' tapering dose of 10 mg tablets over four days, which he finds very effective. He has not used colchicine for his gout flares.  He experiences gout flares approximately once a year and has not identified any specific dietary or lifestyle triggers, such as alcohol or seafood consumption, that precipitate these episodes.  He mentions a history of hip pain, for which he had an x-ray revealing 'bone on bone' contact. He has also experienced tennis elbow and knee pain in the past, which he attributes to physical activity.    Health Maintenance Due  Topic Date Due   Medicare Annual Wellness (AWV)  03/21/2024   INFLUENZA VACCINE  05/31/2024    Past Medical History:  Diagnosis Date   Gout    History of chicken pox    Hx of adenomatous polyp of colon 10/14/2018    Past Surgical History:  Procedure Laterality Date   COLONOSCOPY W/ POLYPECTOMY  10/08/2018   TONSILLECTOMY       Current Outpatient Medications:    indomethacin  (INDOCIN ) 50 MG capsule, Take 1  capsule (50 mg total) by mouth 3 (three) times daily with meals., Disp: 15 capsule, Rfl: 0   predniSONE  (DELTASONE ) 20 MG tablet, Take 2 tablets (40 mg total) by mouth daily with breakfast for 5 days., Disp: 10 tablet, Rfl: 0  No Known Allergies ROS neg/noncontributory except as noted HPI/below      Objective:     BP (!) 160/82 (BP Location: Left Arm, Patient Position: Sitting, Cuff Size: Normal)   Pulse 74   Temp (!) 97.5 F (36.4 C) (Temporal)   Resp 18   Ht 5' 7 (1.702 m)   Wt 198 lb (89.8 kg)   SpO2 94%   BMI 31.01 kg/m  Wt Readings from Last 3 Encounters:  06/14/24 198 lb (89.8 kg)  11/08/23 195 lb 5 oz (88.6 kg)  03/29/22 194 lb (88 kg)    Physical Exam   Gen: WDWN NAD HEENT: NCAT, conjunctiva not injected, sclera nonicteric CARDIAC: RRR, S1S2+, no murmur.  EXT:  no edema MSK: R ankle-pink, warm, sl tender.    NEURO: A&O x3.  CN II-XII intact.  PSYCH: normal mood. Good eye contact  Reviewed labs     Assessment & Plan:  Acute idiopathic gout of right ankle  Elevated blood pressure reading  Other orders -     predniSONE ; Take 2 tablets (40 mg total) by mouth daily with breakfast for 5 days.  Dispense: 10  tablet; Refill: 0 -     Indomethacin ; Take 1 capsule (50 mg total) by mouth 3 (three) times daily with meals.  Dispense: 15 capsule; Refill: 0  Assessment and Plan Assessment & Plan Gout flare, right ankle   He is experiencing an acute gout flare in the right ankle for three days, presenting with swelling, redness, and tenderness. Previous treatments with prednisone  and indomethacin  have been effective. No dietary or lifestyle triggers identified. Prefers prednisone  due to its past effectiveness. Colchicine was discussed as a non-steroidal alternative but has not been used before. Prescribe prednisone  20 mg, two tablets once daily for five days. Prescribe indomethacin  for future flares. Advise against concurrent use of prednisone  and indomethacin . Recommend  follow-up with Doctor Mahlon to discuss potential preventative treatment options.  Osteoarthritis of hip   Chronic osteoarthritis of the hip with bone-on-bone contact causes daily pain and functional limitations. Hip replacement surgery is under consideration as pain severity increases. Monitor hip pain and consider surgery when pain becomes intolerable.  Elevated blood pressure, needs monitoring   Blood pressure is elevated at 168/90 mmHg, reduced to 160/82 mmHg upon recheck. Pain and anxiety may contribute. Recent visit with Doctor Mahlon in January. Monitor blood pressure at home and provide data to Doctor Tabori for further evaluation.    Return for f/u Dr. Mahlon.  Jenkins CHRISTELLA Carrel, MD

## 2024-08-02 ENCOUNTER — Encounter: Payer: Self-pay | Admitting: Family Medicine

## 2024-08-02 ENCOUNTER — Ambulatory Visit: Admitting: Family Medicine

## 2024-08-02 VITALS — BP 138/78 | HR 67 | Temp 97.7°F | Wt 198.4 lb

## 2024-08-02 DIAGNOSIS — Z8739 Personal history of other diseases of the musculoskeletal system and connective tissue: Secondary | ICD-10-CM | POA: Diagnosis not present

## 2024-08-02 DIAGNOSIS — R972 Elevated prostate specific antigen [PSA]: Secondary | ICD-10-CM

## 2024-08-02 DIAGNOSIS — E66811 Obesity, class 1: Secondary | ICD-10-CM

## 2024-08-02 LAB — BASIC METABOLIC PANEL WITH GFR
BUN: 15 mg/dL (ref 6–23)
CO2: 31 meq/L (ref 19–32)
Calcium: 9.6 mg/dL (ref 8.4–10.5)
Chloride: 101 meq/L (ref 96–112)
Creatinine, Ser: 1 mg/dL (ref 0.40–1.50)
GFR: 76.83 mL/min (ref >60.00)
Glucose, Bld: 82 mg/dL (ref 70–99)
Potassium: 5 meq/L (ref 3.5–5.1)
Sodium: 139 meq/L (ref 135–145)

## 2024-08-02 LAB — LIPID PANEL
Cholesterol: 137 mg/dL (ref 0–200)
HDL: 39.4 mg/dL (ref >39.00)
LDL Cholesterol: 59 mg/dL (ref 0–99)
NonHDL: 97.11
Total CHOL/HDL Ratio: 3
Triglycerides: 190 mg/dL — ABNORMAL HIGH (ref 0.0–149.0)
VLDL: 38 mg/dL (ref 0.0–40.0)

## 2024-08-02 LAB — CBC WITH DIFFERENTIAL/PLATELET
Basophils Absolute: 0 K/uL (ref 0.0–0.1)
Basophils Relative: 0.6 % (ref 0.0–3.0)
Eosinophils Absolute: 0.1 K/uL (ref 0.0–0.7)
Eosinophils Relative: 1.3 % (ref 0.0–5.0)
HCT: 44.8 % (ref 39.0–52.0)
Hemoglobin: 14.7 g/dL (ref 13.0–17.0)
Lymphocytes Relative: 29.5 % (ref 12.0–46.0)
Lymphs Abs: 1.8 K/uL (ref 0.7–4.0)
MCHC: 32.9 g/dL (ref 30.0–36.0)
MCV: 92.1 fl (ref 78.0–100.0)
Monocytes Absolute: 0.7 K/uL (ref 0.1–1.0)
Monocytes Relative: 11.4 % (ref 3.0–12.0)
Neutro Abs: 3.5 K/uL (ref 1.4–7.7)
Neutrophils Relative %: 57.2 % (ref 43.0–77.0)
Platelets: 186 K/uL (ref 150.0–400.0)
RBC: 4.86 Mil/uL (ref 4.22–5.81)
RDW: 13.3 % (ref 11.5–15.5)
WBC: 6.2 K/uL (ref 4.0–10.5)

## 2024-08-02 LAB — HEPATIC FUNCTION PANEL
ALT: 11 U/L (ref 0–53)
AST: 16 U/L (ref 0–37)
Albumin: 4.4 g/dL (ref 3.5–5.2)
Alkaline Phosphatase: 74 U/L (ref 39–117)
Bilirubin, Direct: 0.1 mg/dL (ref 0.0–0.3)
Total Bilirubin: 0.7 mg/dL (ref 0.2–1.2)
Total Protein: 7.1 g/dL (ref 6.0–8.3)

## 2024-08-02 LAB — PSA: PSA: 6.6 ng/mL — ABNORMAL HIGH (ref 0.10–4.00)

## 2024-08-02 LAB — URIC ACID: Uric Acid, Serum: 6.3 mg/dL (ref 4.0–7.8)

## 2024-08-02 NOTE — Patient Instructions (Signed)
 Follow up in 6 months to recheck weight and labs We'll notify you of your lab results and make any changes if needed Continue to work on healthy diet and regular exercise IF you need more Meloxicam  for joint pain- let me know! We'll decide based on your uric acid level how we want to proceed but tentatively plan on just treating as needed Call with any questions or concerns Stay Safe!  Stay Healthy! Happy Fall!!!

## 2024-08-02 NOTE — Progress Notes (Signed)
   Subjective:    Patient ID: Randy Henderson, male    DOB: 1956-06-08, 68 y.o.   MRN: [id number]  HPI Obesity- pt's weight and BMI are stable.  BMI 31.  Pt is exercising regularly.  Doing core work almost daily.  Elevated PSA- pt has hx of mildly elevated PSA.  Had bx that showed very small degree of cancer and was told to have PSA's checked every 6 months.  Told Urology that he would follow up here for his 6 month check.  Gout- seems to have 1 episode yearly.  Isn't sure if he wants to start preventative medication as someone suggested bc he doesn't think he wants to trade a few days of NSAIDs for a daily medication.  Wants to know my opinion.   Review of Systems For ROS see HPI     Objective:   Physical Exam Vitals reviewed.  Constitutional:      General: He is not in acute distress.    Appearance: Normal appearance. He is well-developed. He is not ill-appearing.  HENT:     Head: Normocephalic and atraumatic.  Eyes:     Extraocular Movements: Extraocular movements intact.     Conjunctiva/sclera: Conjunctivae normal.     Pupils: Pupils are equal, round, and reactive to light.  Neck:     Thyroid : No thyromegaly.  Cardiovascular:     Rate and Rhythm: Normal rate and regular rhythm.     Pulses: Normal pulses.     Heart sounds: Normal heart sounds. No murmur heard. Pulmonary:     Effort: Pulmonary effort is normal. No respiratory distress.     Breath sounds: Normal breath sounds.  Abdominal:     General: Bowel sounds are normal. There is no distension.     Palpations: Abdomen is soft.  Musculoskeletal:     Cervical back: Normal range of motion and neck supple.     Right lower leg: No edema.     Left lower leg: No edema.  Lymphadenopathy:     Cervical: No cervical adenopathy.  Skin:    General: Skin is warm and dry.  Neurological:     General: No focal deficit present.     Mental Status: He is alert and oriented to person, place, and time.     Cranial Nerves: No cranial  nerve deficit.  Psychiatric:        Mood and Affect: Mood normal.        Behavior: Behavior normal.           Assessment & Plan:

## 2024-08-06 ENCOUNTER — Ambulatory Visit: Payer: Self-pay | Admitting: Family Medicine

## 2024-08-19 ENCOUNTER — Ambulatory Visit: Payer: PRIVATE HEALTH INSURANCE | Admitting: Family Medicine

## 2024-08-29 DIAGNOSIS — Z8739 Personal history of other diseases of the musculoskeletal system and connective tissue: Secondary | ICD-10-CM | POA: Insufficient documentation

## 2024-08-29 NOTE — Assessment & Plan Note (Signed)
 Weight and BMI are stable.  States he is exercising regularly.  Applauded his efforts.  Check labs to risk stratify.

## 2024-08-29 NOTE — Assessment & Plan Note (Signed)
 Pt has hx of ~1 gout flare yearly.  Discussed that based on that amount, I would be hesitant to start daily medication.  Will check uric acid level to see if this is persistently elevated.  If yes, would be better reason to start Allopurinol.  Pt expressed understanding and is in agreement w/ plan.

## 2024-08-29 NOTE — Assessment & Plan Note (Signed)
 Pt has hx of elevated PSA and was told he has a 'very small degree of cancer'.  Was told to have PSA's q6 months.  He told urology he would do that here today.  Lab ordered

## 2024-09-02 ENCOUNTER — Encounter: Payer: Self-pay | Admitting: Radiology

## 2025-01-30 ENCOUNTER — Ambulatory Visit: Payer: PRIVATE HEALTH INSURANCE | Admitting: Family Medicine
